# Patient Record
Sex: Male | Born: 1975 | Race: White | Hispanic: No | Marital: Married | Smoking: Former smoker
Health system: Southern US, Community
[De-identification: ages and names within clinical notes are randomized; demographics above are authoritative.]

## PROBLEM LIST (undated history)

## (undated) DIAGNOSIS — S92309A Fracture of unspecified metatarsal bone(s), unspecified foot, initial encounter for closed fracture: Secondary | ICD-10-CM

## (undated) DIAGNOSIS — R7303 Prediabetes: Secondary | ICD-10-CM

## (undated) DIAGNOSIS — R0683 Snoring: Secondary | ICD-10-CM

## (undated) HISTORY — PX: TONSILLECTOMY: SUR1361

---

## 2009-03-11 ENCOUNTER — Ambulatory Visit: Payer: Self-pay | Admitting: Diagnostic Radiology

## 2009-03-11 ENCOUNTER — Emergency Department (HOSPITAL_BASED_OUTPATIENT_CLINIC_OR_DEPARTMENT_OTHER): Admission: EM | Admit: 2009-03-11 | Discharge: 2009-03-11 | Payer: Self-pay | Admitting: Emergency Medicine

## 2011-02-17 ENCOUNTER — Encounter (INDEPENDENT_AMBULATORY_CARE_PROVIDER_SITE_OTHER): Payer: Self-pay | Admitting: General Surgery

## 2011-02-17 ENCOUNTER — Ambulatory Visit (INDEPENDENT_AMBULATORY_CARE_PROVIDER_SITE_OTHER): Payer: BC Managed Care – PPO | Admitting: General Surgery

## 2011-02-17 DIAGNOSIS — L0501 Pilonidal cyst with abscess: Secondary | ICD-10-CM

## 2011-02-17 DIAGNOSIS — L0591 Pilonidal cyst without abscess: Secondary | ICD-10-CM | POA: Insufficient documentation

## 2011-02-17 NOTE — Progress Notes (Signed)
Subjective:     Patient ID: Samuel Sellers, male   DOB: [birth year], 35 y.o.   MRN: [id number]    There were no vitals taken for this visit.    HPI  Is here for followup of his pilonidal cyst with abscess. He states the doxycycline helped that as well as his hidradenitis. Review of Systems     Objective:   Physical Exam Skin: His gluteal cleft area appears a healed wound. There still multiple small sinus tracts.    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 The hidradenitis has responded to the doxycycline. Plan:     Extensive pilonidal cystectomy. I have discussed the procedure and risks with him. Risks include but not limited to bleeding, wound problems, anesthesia, recurrence. Discussed the fact that he would have an open wound which may take months to heal. It were required daily dressing changes. He will have work restrictions. Seems to understand this and agrees with the plan.

## 2011-03-10 ENCOUNTER — Telehealth (INDEPENDENT_AMBULATORY_CARE_PROVIDER_SITE_OTHER): Payer: Self-pay

## 2011-03-10 NOTE — Telephone Encounter (Signed)
[facility]ellers.  He is out of town at [employer]. His surgery is Monday 7/23 - I okayed his CBC and BMET to be drawn before surgery

## 2011-03-15 DIAGNOSIS — L0591 Pilonidal cyst without abscess: Secondary | ICD-10-CM

## 2011-03-15 HISTORY — PX: PILONIDAL CYST EXCISION: SHX744

## 2011-03-17 ENCOUNTER — Telehealth (INDEPENDENT_AMBULATORY_CARE_PROVIDER_SITE_OTHER): Payer: Self-pay

## 2011-03-17 ENCOUNTER — Other Ambulatory Visit (INDEPENDENT_AMBULATORY_CARE_PROVIDER_SITE_OTHER): Payer: Self-pay

## 2011-03-17 ENCOUNTER — Ambulatory Visit (INDEPENDENT_AMBULATORY_CARE_PROVIDER_SITE_OTHER): Payer: BC Managed Care – PPO | Admitting: General Surgery

## 2011-03-17 DIAGNOSIS — L0591 Pilonidal cyst without abscess: Secondary | ICD-10-CM

## 2011-03-17 NOTE — Progress Notes (Signed)
Dressing changed by Milas Hock.  Wound looks good.  Will arrange for home health care to assist with wound care.

## 2011-03-18 ENCOUNTER — Encounter (INDEPENDENT_AMBULATORY_CARE_PROVIDER_SITE_OTHER): Payer: BC Managed Care – PPO

## 2011-03-22 ENCOUNTER — Other Ambulatory Visit (INDEPENDENT_AMBULATORY_CARE_PROVIDER_SITE_OTHER): Payer: Self-pay

## 2011-03-22 ENCOUNTER — Telehealth (INDEPENDENT_AMBULATORY_CARE_PROVIDER_SITE_OTHER): Payer: Self-pay | Admitting: General Surgery

## 2011-03-22 DIAGNOSIS — G8918 Other acute postprocedural pain: Secondary | ICD-10-CM

## 2011-03-22 MED ORDER — HYDROCODONE-ACETAMINOPHEN 5-325 MG PO TABS: 1.0000 | ORAL_TABLET | Freq: Four times a day (QID) | ORAL | Status: AC | PRN

## 2011-03-22 NOTE — Telephone Encounter (Signed)
Norco 5/325 #30 called to [facility] [address]  Pt aware.

## 2011-03-22 NOTE — Telephone Encounter (Signed)
Patient called back about his pain medicine. Patient is taking Oxycodone 5/325, has been taking two every four hours since Saturday with Ibuprofen between but still having a lot of sharp, stabbing pain and having trouble sleeping. Patient is not doing warm tub soaks, states he was told not to get this area wet. Please advise. [phone].

## 2011-03-22 NOTE — Telephone Encounter (Signed)
Spoke to Dr Abbey Chatters who advised pt to take Oxycodone and 3 ibuprofen 30 minutes prior to dressing changes and to make sure the gauze is soaked prior to removing. Patient states he will try this. He also states his real problem is sleeping. I advised he try the same method prior to going to bed. Also states he has 9 pills left and if he is taking 2 every 4 hours he needs a refill soon. Please advise.

## 2011-03-30 ENCOUNTER — Telehealth (INDEPENDENT_AMBULATORY_CARE_PROVIDER_SITE_OTHER): Payer: Self-pay

## 2011-03-30 ENCOUNTER — Encounter (INDEPENDENT_AMBULATORY_CARE_PROVIDER_SITE_OTHER): Payer: Self-pay

## 2011-03-30 ENCOUNTER — Other Ambulatory Visit (INDEPENDENT_AMBULATORY_CARE_PROVIDER_SITE_OTHER): Payer: Self-pay

## 2011-03-30 MED ORDER — OXYCODONE-ACETAMINOPHEN 5-325 MG PO TABS: 1.0000 | ORAL_TABLET | ORAL | Status: DC | PRN

## 2011-03-30 NOTE — Telephone Encounter (Signed)
Pt called for a refill on his Oxycodone.  The Vicodin is not helping his pain or helping him sleep.  Dr. Carolynne Edouard agreed to refill the Oxycodone 5/325, #40.  I called pt to make him aware.

## 2011-04-12 ENCOUNTER — Encounter (INDEPENDENT_AMBULATORY_CARE_PROVIDER_SITE_OTHER): Payer: Self-pay | Admitting: General Surgery

## 2011-04-14 ENCOUNTER — Encounter (INDEPENDENT_AMBULATORY_CARE_PROVIDER_SITE_OTHER): Payer: BC Managed Care – PPO | Admitting: General Surgery

## 2011-04-14 ENCOUNTER — Ambulatory Visit (INDEPENDENT_AMBULATORY_CARE_PROVIDER_SITE_OTHER): Payer: BC Managed Care – PPO | Admitting: General Surgery

## 2011-04-14 ENCOUNTER — Encounter (INDEPENDENT_AMBULATORY_CARE_PROVIDER_SITE_OTHER): Payer: Self-pay | Admitting: General Surgery

## 2011-04-14 DIAGNOSIS — L0591 Pilonidal cyst without abscess: Secondary | ICD-10-CM

## 2011-04-14 NOTE — Progress Notes (Signed)
Operation:  Pilonidal cystectomy  Date: March 15, 2011  Pathology: Consistent with diagnosis  HPI: He is here for a postoperative visit. No pain. No bleeding. There is a fair amount of drainage. No problems with dressing changes.   Physical Exam: Pilonidal wound is clean with granulation tissue forming. Some yellowish green drainage.   Assessment: Wound healing in well by secondary intention.  Plan: Continue dressing changes. Can return to work if he can keep the area adequately covered. Return visit one month.

## 2011-05-11 ENCOUNTER — Encounter (INDEPENDENT_AMBULATORY_CARE_PROVIDER_SITE_OTHER): Payer: Self-pay

## 2011-05-12 ENCOUNTER — Encounter (INDEPENDENT_AMBULATORY_CARE_PROVIDER_SITE_OTHER): Payer: Self-pay | Admitting: General Surgery

## 2011-05-12 ENCOUNTER — Encounter (INDEPENDENT_AMBULATORY_CARE_PROVIDER_SITE_OTHER): Payer: Self-pay

## 2011-05-12 ENCOUNTER — Ambulatory Visit (INDEPENDENT_AMBULATORY_CARE_PROVIDER_SITE_OTHER): Payer: BC Managed Care – PPO | Admitting: General Surgery

## 2011-05-12 DIAGNOSIS — L0591 Pilonidal cyst without abscess: Secondary | ICD-10-CM

## 2011-05-12 NOTE — Progress Notes (Signed)
Operation:  Pilonidal cystectomy  Date: March 15, 2011  Pathology: Consistent with diagnosis  HPI: He is here for another postoperative visit. No pain. No bleeding. There is much less drainage. No problems with dressing changes.   Physical Exam: Pilonidal wound is clean and is almost closed. Minimal drainage.   Assessment: Wound continues to heal  well by secondary intention.  Plan: Continue dressing changes. Can return to work next week.  Return visit one month.

## 2011-05-14 NOTE — Telephone Encounter (Signed)
Encounter resolved.

## 2011-06-09 ENCOUNTER — Encounter (INDEPENDENT_AMBULATORY_CARE_PROVIDER_SITE_OTHER): Payer: BC Managed Care – PPO | Admitting: General Surgery

## 2011-06-15 ENCOUNTER — Encounter (INDEPENDENT_AMBULATORY_CARE_PROVIDER_SITE_OTHER): Payer: Self-pay | Admitting: General Surgery

## 2011-09-23 ENCOUNTER — Emergency Department (HOSPITAL_BASED_OUTPATIENT_CLINIC_OR_DEPARTMENT_OTHER)
Admission: EM | Admit: 2011-09-23 | Discharge: 2011-09-23 | Disposition: A | Discharge status: Home / Self Care | Payer: Worker's Compensation | Attending: Emergency Medicine | Admitting: Emergency Medicine

## 2011-09-23 ENCOUNTER — Emergency Department (INDEPENDENT_AMBULATORY_CARE_PROVIDER_SITE_OTHER): Payer: Worker's Compensation

## 2011-09-23 ENCOUNTER — Encounter (HOSPITAL_BASED_OUTPATIENT_CLINIC_OR_DEPARTMENT_OTHER): Payer: Self-pay | Admitting: *Deleted

## 2011-09-23 DIAGNOSIS — M25569 Pain in unspecified knee: Secondary | ICD-10-CM

## 2011-09-23 MED ORDER — HYDROCODONE-ACETAMINOPHEN 5-500 MG PO TABS: 1.0000 | ORAL_TABLET | Freq: Four times a day (QID) | ORAL | Status: AC | PRN

## 2011-09-23 NOTE — ED Notes (Signed)
Right knee injury while playing basketball. He fell onto his knee. Drove himself here and is ambulatory. States no pain with standing only painful when he bends.

## 2011-09-23 NOTE — ED Provider Notes (Signed)
History     CSN: [id number]  Arrival date & time 09/23/11  1137   First MD Initiated Contact with Patient 09/23/11 1205      Chief Complaint  Patient presents with  . Knee Pain    (Consider location/radiation/quality/duration/timing/severity/associated sxs/prior treatment) HPI Comments: Pt states that he was playing basketball and he fell on his knee:pt states that he is having patellar pain with pain  Patient is a 36 y.o. male presenting with knee pain. The history is provided by the patient.  Knee Pain This is a new problem. The current episode started today. The problem occurs constantly. The problem has been unchanged. The symptoms are aggravated by bending. He has tried acetaminophen for the symptoms.  Knee Pain This is a new problem. The current episode started today. The problem occurs constantly. The problem has been unchanged. The symptoms are aggravated by bending. He has tried acetaminophen for the symptoms.    Past Medical History  Diagnosis Date  . Pilonidal cyst     Past Surgical History  Procedure Date  . Tonsillectomy   . Pilonidal cyst excision 03/15/11    Family History  Problem Relation Age of Onset  . Cancer Mother     breast  . Diabetes Mother   . Cancer Father     skin    History  Substance Use Topics  . Smoking status: Former Games developer  . Smokeless tobacco: Not on file  . Alcohol Use: 0.0 oz/week    6-12 Cans of beer per week      Review of Systems  All other systems reviewed and are negative.    Allergies  Review of patient's allergies indicates no known allergies.  Home Medications   Current Outpatient Rx  Name Route Sig Dispense Refill  . B COMPLEX PO TABS Oral Take 1 tablet by mouth daily.        BP 123/89  Pulse 104  Temp(Src) 98 F (36.7 C) (Oral)  Resp 20  SpO2 99%  Physical Exam  Nursing note and vitals reviewed. Constitutional: He is oriented to person, place, and time. He appears well-developed and  well-nourished.  HENT:  Head: Normocephalic and atraumatic.  Cardiovascular: Normal rate and regular rhythm.   Pulmonary/Chest: Effort normal and breath sounds normal.  Musculoskeletal: Normal range of motion.       No obvious swelling or deformity noted to the right knee  Neurological: He is alert and oriented to person, place, and time.  Skin: Skin is warm and dry.    ED Course  Procedures (including critical care time)  Labs Reviewed - No data to display Dg Knee Complete 4 Views Right  09/23/2011  *RADIOLOGY REPORT*  Clinical Data: Knee pain  RIGHT KNEE - COMPLETE 4+ VIEW  Comparison: None  Findings: There is no evidence of fracture or dislocation.  There is no evidence of arthropathy or other focal bone abnormality. Soft tissues are unremarkable.  IMPRESSION: Negative exam.  Original Report Authenticated By: Rosealee Albee, M.D.     1. Knee pain       MDM  No acute bony of soft tissue abnormality noted:pt to follow up with [facility] for continued symptoms    Medical screening examination/treatment/procedure(s) were performed by non-physician practitioner and as supervising physician I was immediately available for consultation/collaboration. Osvaldo Human, M.D.    Teressa Lower, NP 09/23/11 1310  Carleene Cooper III, MD 09/23/11 2012

## 2014-12-16 ENCOUNTER — Encounter: Payer: Self-pay | Admitting: Family Medicine

## 2014-12-16 ENCOUNTER — Encounter: Payer: Self-pay | Admitting: Physician Assistant

## 2014-12-16 ENCOUNTER — Ambulatory Visit (INDEPENDENT_AMBULATORY_CARE_PROVIDER_SITE_OTHER): Payer: BLUE CROSS/BLUE SHIELD | Admitting: Physician Assistant

## 2014-12-16 VITALS — BP 126/96 | HR 96 | Temp 98.9°F | Resp 20 | Wt 327.0 lb

## 2014-12-16 DIAGNOSIS — J029 Acute pharyngitis, unspecified: Secondary | ICD-10-CM | POA: Diagnosis not present

## 2014-12-16 DIAGNOSIS — R509 Fever, unspecified: Secondary | ICD-10-CM | POA: Diagnosis not present

## 2014-12-16 LAB — INFLUENZA A AND B
INFLUENZA B AG: NEGATIVE
Inflenza A Ag: NEGATIVE

## 2014-12-16 LAB — RAPID STREP SCREEN (MED CTR MEBANE ONLY): Streptococcus, Group A Screen (Direct): NEGATIVE

## 2014-12-16 NOTE — Progress Notes (Signed)
    Patient ID: Melburn PopperRichard C Hemm MRN: [id number], DOB: 1976/07/06, 39 y.o. Date of Encounter: 12/16/2014, 10:33 AM    Chief Complaint:  Chief Complaint  Patient presents with  . sick x 1 day    fever,chills, sore throat, congestion     HPI: 39 y.o. year old white male says he just started getting sick yesterday.   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  Works 24 hour shifts.  Says got off work yesterday morning. Then yesterday afternoon "hit him like a ton of bricks"--weak, tired, chills. Fever last night. Mild sore throat and cough.  Last dose Ibuprofen 4 a.m.  Is off work today. Scheduled to work Tuesday and Thursday.  Is signed up for a class this weekend--says it will be labor intensive, but he is really looking forward to it. Says he signed up for it a year ago. Says his info said to let them know if not coming b/c >60 people on waiting list.      Home Meds:   Outpatient Prescriptions Prior to Visit  Medication Sig Dispense Refill  . b complex vitamins tablet Take 1 tablet by mouth daily.       No facility-administered medications prior to visit.    Allergies: No Known Allergies    Review of Systems: See HPI for pertinent ROS. All other ROS negative.    Physical Exam: Blood pressure 126/96, pulse 96, temperature 98.9 F (37.2 C), temperature source Oral, resp. rate 20, weight 327 lb (148.326 kg)., Body mass index is 39.82 kg/(m^2). General:  Obese WM. Appears in no acute distress. HEENT: Normocephalic, atraumatic, eyes without discharge, sclera non-icteric, nares are without discharge. Bilateral auditory canals clear, TM's are without perforation, pearly grey and translucent with reflective cone of light bilaterally. Oral cavity moist, posterior pharynx with moderate erythema across upper posterior pharynx. No exudate,  peritonsillar abscess. Neck: Supple. No thyromegaly. Positive tenderness with palpation of anterior cervical nodes but unable to palpate any enlarged nodes.  Lungs:  Clear bilaterally to auscultation without wheezes, rales, or rhonchi. Breathing is unlabored. Heart: Regular rhythm. No murmurs, rubs, or gallops. Msk:  Strength and tone normal for age. Extremities/Skin: Warm and dry.  No rashes. Neuro: Alert and oriented X 3. Moves all extremities spontaneously. Gait is normal. CNII-XII grossly in tact. Psych:  Responds to questions appropriately with a normal affect.      ASSESSMENT AND PLAN:  39 y.o. year old male with  1. Viral pharyngitis RST and Flu tests negative.  Treat symptomatically. Note for oow tomorrow.  Call me if fever / symptoms worsen.  Call me if fever not decreasing/resolving over next 48 hours or if symptoms not improving over next 48 hours. (See HPI)  2. Fever and chills - Rapid strep screen - Influenza a and b  3. Sorethroat - Rapid strep screen - Influenza a and b   Signed, [address] Buck[address] LaneMary Beth PearlDixon, [place], [facility] 12/16/2014 10:33 AM

## 2015-04-23 ENCOUNTER — Encounter: Payer: Self-pay | Admitting: Family Medicine

## 2015-04-23 ENCOUNTER — Encounter: Payer: Self-pay | Admitting: Physician Assistant

## 2015-04-23 ENCOUNTER — Ambulatory Visit (INDEPENDENT_AMBULATORY_CARE_PROVIDER_SITE_OTHER): Payer: BLUE CROSS/BLUE SHIELD | Admitting: Physician Assistant

## 2015-04-23 VITALS — BP 96/70 | HR 108 | Temp 103.1°F | Resp 24 | Ht 76.0 in | Wt 317.0 lb

## 2015-04-23 DIAGNOSIS — M791 Myalgia, unspecified site: Secondary | ICD-10-CM

## 2015-04-23 DIAGNOSIS — R6883 Chills (without fever): Secondary | ICD-10-CM

## 2015-04-23 DIAGNOSIS — B349 Viral infection, unspecified: Secondary | ICD-10-CM | POA: Diagnosis not present

## 2015-04-23 DIAGNOSIS — R5081 Fever presenting with conditions classified elsewhere: Secondary | ICD-10-CM | POA: Diagnosis not present

## 2015-04-23 LAB — CBC W/MCH & 3 PART DIFF
HCT: 48 % (ref 39.0–52.0)
HEMOGLOBIN: 16.4 g/dL (ref 13.0–17.0)
LYMPHS ABS: 0.6 10*3/uL — AB (ref 0.7–4.0)
LYMPHS PCT: 20 % (ref 12–46)
MCH: 31.8 pg (ref 26.0–34.0)
MCHC: 34.2 g/dL (ref 30.0–36.0)
MCV: 93 fL (ref 78.0–100.0)
NEUTROS PCT: 68 % (ref 43–77)
Neutro Abs: 2.1 10*3/uL (ref 1.7–7.7)
PLATELETS: 129 10*3/uL — AB (ref 150–400)
RBC: 5.16 MIL/uL (ref 4.22–5.81)
RDW: 13.9 % (ref 11.5–15.5)
WBC mixed population %: 12 % (ref 3–18)
WBC mixed population: 0.4 10*3/uL (ref 0.1–1.8)
WBC: 3.1 10*3/uL — AB (ref 4.0–10.5)

## 2015-04-23 LAB — URINALYSIS, MICROSCOPIC ONLY
CRYSTALS: NONE SEEN [HPF]
Casts: NONE SEEN [LPF]
WBC UA: NONE SEEN WBC/HPF (ref <=5)
YEAST: NONE SEEN [HPF]

## 2015-04-23 LAB — URINALYSIS, ROUTINE W REFLEX MICROSCOPIC
BILIRUBIN URINE: NEGATIVE
GLUCOSE, UA: NEGATIVE
Hgb urine dipstick: NEGATIVE
KETONES UR: NEGATIVE
Leukocytes, UA: NEGATIVE
Nitrite: NEGATIVE
pH: 5.5 (ref 5.0–8.0)

## 2015-04-23 NOTE — Progress Notes (Signed)
Patient ID: Samuel Sellers MRN: [id number], DOB: 11-28-1975, 39 y.o. Date of Encounter: 04/23/2015, 3:57 PM    Chief Complaint:  Chief Complaint  Patient presents with  . Cold chills, achey all over, side pain, fatigue, cough     HPI: 39 y.o. year old white male presents with above symptoms.  Says that symptoms started Monday night 04/21/15. Says that at that time he developed chills. Also felt achy all over. He has not checked his temperature at home any to know what it has been running. Says he's had chills to the point that he has been having to stay under blankets or go outside where it is close to 100 just to warm up. Says he has had minimal cough. No sore throat. No nasal congestion or mucus from the nose. No abdominal pain no nausea vomiting or diarrhea. No dysuria. Has found no ticks on him. Has seen no rash. Has had no headache. No known sick contacts. Works for [employer]. Missed some work yesterday (works 24 hour shift). Supposed to work tomorrow and Friday then off Saturday Sunday and supposed to return Monday.     Home Meds:   Outpatient Prescriptions Prior to Visit  Medication Sig Dispense Refill  . b complex vitamins tablet Take 1 tablet by mouth daily.       No facility-administered medications prior to visit.    Allergies: No Known Allergies    Review of Systems: See HPI for pertinent ROS. All other ROS negative.    Physical Exam: Blood pressure 96/70, pulse 108, temperature 103.1 F (39.5 C), temperature source Oral, resp. rate 24, height 6\' 4"  (1.93 m), weight 317 lb (143.79 kg)., Body mass index is 38.6 kg/(m^2). General:  Tall, Obese WM. Appears in no acute distress. HEENT: Normocephalic, atraumatic, eyes without discharge, sclera non-icteric, nares are without discharge. Bilateral auditory canals clear, TM's are without perforation, pearly grey and translucent with reflective cone of light bilaterally. Oral cavity moist,  posterior pharynx without exudate, erythema, peritonsillar abscess.  Neck: Supple. No thyromegaly. No lymphadenopathy. Lungs: Clear bilaterally to auscultation without wheezes, rales, or rhonchi. Breathing is unlabored. Heart: Regular rhythm. No murmurs, rubs, or gallops. Abdomen: Soft, non-tender, non-distended with normoactive bowel sounds. No hepatomegaly. No rebound/guarding. No obvious abdominal masses. Msk:  Strength and tone normal for age. Extremities/Skin: Warm and dry. No rashes. Neuro: Alert and oriented X 3. Moves all extremities spontaneously. Gait is normal. CNII-XII grossly in tact. Psych:  Responds to questions appropriately with a normal affect.   Results for orders placed or performed in visit on 04/23/15  CBC w/MCH & 3 Part Diff  Result Value Ref Range   WBC 3.1 (L) 4.0 - 10.5 K/uL   RBC 5.16 4.22 - 5.81 MIL/uL   Hemoglobin 16.4 13.0 - 17.0 g/dL   HCT 40.9 81.1 - 91.4 %   MCV 93.0 78.0 - 100.0 fL   MCH 31.8 26.0 - 34.0 pg   MCHC 34.2 30.0 - 36.0 g/dL   RDW 78.2 95.6 - 21.3 %   Platelets 129 (L) 150 - 400 K/uL   Neutrophils Relative % 68 43 - 77 %   Neutro Abs 2.1 1.7 - 7.7 K/uL   Lymphocytes Relative 20 12 - 46 %   Lymphs Abs 0.6 (L) 0.7 - 4.0 K/uL   WBC mixed population % 12 3 - 18 %   WBC mixed population 0.4 0.1 - 1.8 K/uL  Urinalysis, Routine w reflex microscopic (not at [facility])  Result Value Ref Range   Color, Urine AMBER (A) YELLOW   APPearance CLEAR CLEAR   Specific Gravity, Urine >1.030 1.001 - 1.035   pH 5.5 5.0 - 8.0   Glucose, UA NEGATIVE NEGATIVE   Bilirubin Urine NEGATIVE NEGATIVE   Ketones, ur NEGATIVE NEGATIVE   Hgb urine dipstick NEGATIVE NEGATIVE   Protein, ur 1+ (A) NEGATIVE   Nitrite NEGATIVE NEGATIVE   Leukocytes, UA NEGATIVE NEGATIVE  Urine Microscopic  Result Value Ref Range   WBC, UA NONE SEEN <=5 WBC/HPF   RBC / HPF 0-2 <=2 RBC/HPF   Squamous Epithelial / LPF 0-5 <=5 HPF   Bacteria, UA FEW (A) NONE SEEN HPF   Crystals NONE SEEN  NONE SEEN HPF   Casts NONE SEEN NONE SEEN LPF   Yeast NONE SEEN NONE SEEN HPF   Urine-Other MUCUS STRANDS      ASSESSMENT AND PLAN:  39 y.o. year old male with     1.Viral illness Reviewed CBC while pt here in office. WBCs and neutrophils normal. Consistent with this being viral illness versus bacterial. Urinalysis normal. Will follow-up with remaining lab results. As well, if patient starts to develop any new additional symptoms especially rash or headache, etc--then he is to follow-up immediately. He is to take Tylenol and Motrin regularly to keep fever controlled and this also should help relieve symptoms of achiness and chills. He says that he does have a thermometer at home. I told him to let the medications wear off and check temperature tomorrow afternoon and if it is any higher, then follow-up immediately. If fever is not decreasing over the next 24-48 hours then follow-up as well.    Note given for out of work yesterday, out of work tomorrow which is September 1 and the next day which is September 2. He is then already scheduled to be out of work September 3 and 4th and can return to work Monday, September 5.  2. Fever presenting with conditions classified elsewhere - CBC w/MCH & 3 Part Diff - B. burgdorfi antibodies by WB - Rocky mtn spotted fvr abs pnl(IgG+IgM) - Urinalysis, Routine w reflex microscopic (not at [facility])  3. Chills - CBC w/MCH & 3 Part Diff - B. burgdorfi antibodies by WB - Rocky mtn spotted fvr abs pnl(IgG+IgM) - Urinalysis, Routine w reflex microscopic (not at [facility])  4. Generalized muscle ache - CBC w/MCH & 3 Part Diff - B. burgdorfi antibodies by WB - Rocky mtn spotted fvr abs pnl(IgG+IgM) - Urinalysis, Routine w reflex microscopic (not at [facility])   Signed, [facility] [place], [place], [facility] 04/23/2015 3:57 PM

## 2015-04-24 LAB — ROCKY MTN SPOTTED FVR ABS PNL(IGG+IGM)
RMSF IGM: 0.55 IV
RMSF IgG: 0.88 IV — ABNORMAL HIGH

## 2015-04-24 LAB — B. BURGDORFI ANTIBODIES BY WB
B BURGDORFERI IGG ABS (IB): NEGATIVE
B burgdorferi IgM Abs (IB): NEGATIVE

## 2015-06-18 ENCOUNTER — Emergency Department (HOSPITAL_BASED_OUTPATIENT_CLINIC_OR_DEPARTMENT_OTHER): Payer: Worker's Compensation

## 2015-06-18 ENCOUNTER — Encounter (HOSPITAL_BASED_OUTPATIENT_CLINIC_OR_DEPARTMENT_OTHER): Payer: Self-pay

## 2015-06-18 ENCOUNTER — Emergency Department (HOSPITAL_BASED_OUTPATIENT_CLINIC_OR_DEPARTMENT_OTHER)
Admission: EM | Admit: 2015-06-18 | Discharge: 2015-06-18 | Disposition: A | Discharge status: Home / Self Care | Payer: Worker's Compensation | Attending: Emergency Medicine | Admitting: Emergency Medicine

## 2015-06-18 DIAGNOSIS — Y9389 Activity, other specified: Secondary | ICD-10-CM | POA: Insufficient documentation

## 2015-06-18 DIAGNOSIS — Z87891 Personal history of nicotine dependence: Secondary | ICD-10-CM | POA: Insufficient documentation

## 2015-06-18 DIAGNOSIS — Y9289 Other specified places as the place of occurrence of the external cause: Secondary | ICD-10-CM | POA: Diagnosis not present

## 2015-06-18 DIAGNOSIS — Z872 Personal history of diseases of the skin and subcutaneous tissue: Secondary | ICD-10-CM | POA: Insufficient documentation

## 2015-06-18 DIAGNOSIS — S96812A Strain of other specified muscles and tendons at ankle and foot level, left foot, initial encounter: Secondary | ICD-10-CM | POA: Diagnosis not present

## 2015-06-18 DIAGNOSIS — X58XXXA Exposure to other specified factors, initial encounter: Secondary | ICD-10-CM | POA: Diagnosis not present

## 2015-06-18 DIAGNOSIS — S93699A Other sprain of unspecified foot, initial encounter: Secondary | ICD-10-CM

## 2015-06-18 DIAGNOSIS — Y998 Other external cause status: Secondary | ICD-10-CM | POA: Diagnosis not present

## 2015-06-18 DIAGNOSIS — S99922A Unspecified injury of left foot, initial encounter: Secondary | ICD-10-CM | POA: Diagnosis present

## 2015-06-18 MED ORDER — NAPROXEN 500 MG PO TABS: 500.0000 mg | ORAL_TABLET | Freq: Two times a day (BID) | ORAL | Status: DC

## 2015-06-18 NOTE — Discharge Instructions (Signed)
Please read and follow all provided instructions.  Your diagnoses today include:  1. Plantar fascia rupture     Tests performed today include:  An x-ray of the affected area - does NOT show any broken bones  Vital signs. See below for your results today.   Medications prescribed:   Naproxen - anti-inflammatory pain medication  Do not exceed 500mg  naproxen every 12 hours, take with food  You have been prescribed an anti-inflammatory medication or NSAID. Take with food. Take smallest effective dose for the shortest duration needed for your pain. Stop taking if you experience stomach pain or vomiting.   Take any prescribed medications only as directed.  Home care instructions:   Follow any educational materials contained in this packet  Follow R.I.C.E. Protocol:  R - rest your injury   I  - use ice on injury without applying directly to skin  C - compress injury with bandage or splint  E - elevate the injury as much as possible  Follow-up instructions: Please follow-up with your primary care provider or the provided orthopedic physician (bone specialist) if you continue to have significant pain in 1 week. In this case you may have a more severe injury that requires further care.   Return instructions:   Please return if your toes or feet are numb or tingling, appear gray or blue, or you have severe pain (also elevate the leg and loosen splint or wrap if you were given one)  Please return to the Emergency Department if you experience worsening symptoms.   Please return if you have any other emergent concerns.  Additional Information:  Your vital signs today were: BP 103/73 mmHg   Pulse 120   Temp(Src) 98.4 F (36.9 C) (Oral)   Resp 18   Ht 6' (1.829 m)   Wt 320 lb (145.151 kg)   BMI 43.39 kg/m2   SpO2 97% If your blood pressure (BP) was elevated above 135/85 this visit, please have this repeated by your doctor within one month. -------------- If prescribed crutches  for your injury: use crutches with non-weight bearing for the first few days. Then, you may walk as the pain allows, or as instructed. Start gradually with weight bearing on the affected side. Once you can walk pain free, then try jogging. When you can run forwards, then you can try moving side-to-side. If you cannot walk without crutches in one week, you need a re-check. --------------

## 2015-06-18 NOTE — ED Provider Notes (Signed)
CSN: [id number]     Arrival date & time 06/18/15  1608 History   First MD Initiated Contact with Patient 06/18/15 1613     Chief Complaint  Patient presents with  . Foot Injury     (Consider location/radiation/quality/duration/timing/severity/associated sxs/prior Treatment) HPI Comments: Patient presents with complaint of acute onset left foot pain which started during an agility drill. Patient is a IT sales professional. He was stepping down on his left foot when he felt a pop and immediate pain to the plantar aspect of the foot. He is unable to bear weight due to pain. No other injuries. No treatments prior to arrival. Patient denies numbness or tingling. No knee or hip pains. Course is constant. Pain is worse with ambulation and bearing weight.  The history is provided by the patient.    Past Medical History  Diagnosis Date  . Pilonidal cyst    Past Surgical History  Procedure Laterality Date  . Tonsillectomy    . Pilonidal cyst excision  03/15/11   Family History  Problem Relation Age of Onset  . Cancer Mother     breast  . Diabetes Mother   . Cancer Father     skin   Social History  Substance Use Topics  . Smoking status: Former Games developer  . Smokeless tobacco: None  . Alcohol Use: 0.0 oz/week    6-12 Cans of beer per week    Review of Systems  Constitutional: Negative for activity change.  Musculoskeletal: Positive for myalgias and gait problem. Negative for back pain, joint swelling, arthralgias and neck pain.  Skin: Negative for wound.  Neurological: Negative for weakness and numbness.      Allergies  Review of patient's allergies indicates no known allergies.  Home Medications   Prior to Admission medications   Not on File   BP 103/73 mmHg  Pulse 120  Temp(Src) 98.4 F (36.9 C) (Oral)  Resp 18  Ht 6' (1.829 m)  Wt 320 lb (145.151 kg)  BMI 43.39 kg/m2  SpO2 97% Physical Exam  Constitutional: He appears well-developed and well-nourished.  HENT:  Head:  Normocephalic and atraumatic.  Eyes: Conjunctivae are normal.  Neck: Normal range of motion. Neck supple.  Cardiovascular: Normal pulses.   Pulses:      Dorsalis pedis pulses are 2+ on the right side, and 2+ on the left side.  Musculoskeletal: He exhibits tenderness. He exhibits no edema.       Left hip: Normal.       Left knee: Normal.       Left ankle: Normal. He exhibits normal range of motion. No tenderness. Achilles tendon exhibits normal Thompson's test results.       Left upper leg: Normal.       Left lower leg: Normal.       Left foot: There is tenderness. There is normal range of motion, no bony tenderness and no swelling.  Neurological: He is alert. No sensory deficit.  Motor, sensation, and vascular distal to the injury is fully intact.   Skin: Skin is warm and dry.  Psychiatric: He has a normal mood and affect.  Nursing note and vitals reviewed.   ED Course  Procedures (including critical care time) Labs Review Labs Reviewed - No data to display  Imaging Review Dg Foot Complete Left  06/18/2015  CLINICAL DATA:  Pain after stepping from ladder EXAM: LEFT FOOT - COMPLETE 3+ VIEW COMPARISON:  None. FINDINGS: Frontal, oblique, and lateral views were obtained. There is no demonstrable fracture  or dislocation. Joint spaces appear intact. No erosive change. There is a small posterior calcaneal spur. IMPRESSION: Small posterior calcaneal spur. No demonstrable fracture or dislocation. No appreciable joint space narrowing. Electronically Signed   By: Bretta BangWilliam  Woodruff III M.D.   On: 06/18/2015 16:48   I have personally reviewed and evaluated these images and lab results as part of my medical decision-making.   EKG Interpretation None       4:55 PM Patient seen and examined. Work-up initiated.   Vital signs reviewed and are as follows: BP 103/73 mmHg  Pulse 120  Temp(Src) 98.4 F (36.9 C) (Oral)  Resp 18  Ht 6' (1.829 m)  Wt 320 lb (145.151 kg)  BMI 43.39 kg/m2  SpO2  97%  Patient updated on x-ray results. Provided with Ace wrap and crutches. Referral to Dr. Pearletha ForgeHudnall for further evaluation and treatment. Patient counseled on rice protocol and use of NSAIDs for pain.  MDM   Final diagnoses:  Plantar fascia rupture   Patient with injury mechanism and exam concerning for rupture of the plantar fascia left foot. X-rays negative. Treatment and follow-up as above. Left lower extremity is neurovascularly intact.    Renne CriglerJoshua Keegan Bensch, PA-C 06/18/15 1734  Arby BarretteMarcy Pfeiffer, MD 06/23/15 [phone]

## 2015-06-18 NOTE — ED Notes (Signed)
Patient to treatment room via wheelchair. 

## 2015-06-18 NOTE — ED Notes (Signed)
Foot injury going down steps during agility test at work-heard a pop to left foot

## 2015-06-18 NOTE — ED Notes (Signed)
Patient transported to X-ray 

## 2015-06-20 ENCOUNTER — Ambulatory Visit (INDEPENDENT_AMBULATORY_CARE_PROVIDER_SITE_OTHER): Payer: Worker's Compensation | Admitting: Family Medicine

## 2015-06-20 ENCOUNTER — Encounter: Payer: Self-pay | Admitting: Family Medicine

## 2015-06-20 VITALS — BP 117/79 | HR 80 | Ht 77.0 in | Wt 320.0 lb

## 2015-06-20 DIAGNOSIS — S99922A Unspecified injury of left foot, initial encounter: Secondary | ICD-10-CM

## 2015-06-20 NOTE — Patient Instructions (Signed)
You partially tore an intrinsic muscle of your foot. This is treated similarly to plantar fascia strain/tear though. I would expect you to return to normal activities somewhere in the 2-6 week range. Take tylenol or aleve as needed for pain  Do simple motion exercises of your ankle (up/downs, pretending to draw the alphabet with your ankle). Ice heel for 15 minutes as needed. Elevate above your heart as needed if swollen, bruised. Avoid flat shoes/barefoot walking as much as possible. Arch straps have been shown to help with pain - wear these when up and walking around. Wear shoes with good arch support - you can try something like dr. Jari Sportsmanscholls active series if your shoes don't feel supportive enough. Follow up with me in 2 weeks.

## 2015-06-24 DIAGNOSIS — S99922A Unspecified injury of left foot, initial encounter: Secondary | ICD-10-CM | POA: Insufficient documentation

## 2015-06-24 NOTE — Assessment & Plan Note (Signed)
2/2 tear of deep intrinsic flexor of foot.  Arch binders, start ROM exercises.  Icing, tylenol/nsaids if needed.  Avoid flat shoes, barefoot walking - encouraged good arch supoprts as well.  F/u in 2 weeks.  Out of work in meantime.

## 2015-06-24 NOTE — Progress Notes (Signed)
PCP: Pcp Not In System  Subjective:   HPI: Patient is a 39 y.o. male here for left foot injury.  Patient reports on 10/26 he was doing physical agility test, coming down steps for work and felt a pop plantar left foot. + bruising and swelling. Difficulty bearing weight initially. Pain is sharp, 2/10 at rest, can get up to 10/10 with walking. No prior issues. No skin changes, fever, other complaints.  Past Medical History  Diagnosis Date  . Pilonidal cyst     Current Outpatient Prescriptions on File Prior to Visit  Medication Sig Dispense Refill  . naproxen (NAPROSYN) 500 MG tablet Take 1 tablet (500 mg total) by mouth 2 (two) times daily. 20 tablet 0   No current facility-administered medications on file prior to visit.    Past Surgical History  Procedure Laterality Date  . Tonsillectomy    . Pilonidal cyst excision  03/15/11    Allergies  Allergen Reactions  . Erythromycin     Social History   Social History  . Marital Status: Divorced    Spouse Name: N/A  . Number of Children: N/A  . Years of Education: N/A   Occupational History  . Not on file.   Social History Main Topics  . Smoking status: Former Games developermoker  . Smokeless tobacco: Current User  . Alcohol Use: 0.0 oz/week    6-12 Cans of beer per week  . Drug Use: No  . Sexual Activity: Not on file   Other Topics Concern  . Not on file   Social History Narrative    Family History  Problem Relation Age of Onset  . Cancer Mother     breast  . Diabetes Mother   . Cancer Father     skin    BP 117/79 mmHg  Pulse 80  Ht 6\' 5"  (1.956 m)  Wt 320 lb (145.151 kg)  BMI 37.94 kg/m2  Review of Systems: See HPI above.    Objective:  Physical Exam:  Gen: NAD, comfortable in room.  Left foot/ankle: No gross deformity, swelling, ecchymoses FROM ankle without pain.  On passive full dorsiflexion has plantar foot pain. TTP midportion of plantar foot.  No other tenderness. Negative ant drawer and talar  tilt.   Negative syndesmotic compression. Negative metatarsal squeeze. Thompsons test negative. NV intact distally.  Right foot/ankle: FROM without pain.  MSK u/s:  Metatarsals normal of left foot.  Plantar fascia without evidence of tear or thickening.  Deep musculature of foot however shows one of intrinsic muscles with a small hypoechoic area consistent with partial tear.    Assessment & Plan:  1. Left foot injury - 2/2 tear of deep intrinsic flexor of foot.  Arch binders, start ROM exercises.  Icing, tylenol/nsaids if needed.  Avoid flat shoes, barefoot walking - encouraged good arch supoprts as well.  F/u in 2 weeks.  Out of work in meantime.

## 2015-07-04 ENCOUNTER — Encounter (INDEPENDENT_AMBULATORY_CARE_PROVIDER_SITE_OTHER): Payer: Self-pay

## 2015-07-04 ENCOUNTER — Encounter: Payer: Self-pay | Admitting: Family Medicine

## 2015-07-04 ENCOUNTER — Ambulatory Visit (INDEPENDENT_AMBULATORY_CARE_PROVIDER_SITE_OTHER): Payer: Worker's Compensation | Admitting: Family Medicine

## 2015-07-04 VITALS — BP 136/91 | HR 80 | Ht 77.0 in | Wt 320.0 lb

## 2015-07-04 DIAGNOSIS — S99922D Unspecified injury of left foot, subsequent encounter: Secondary | ICD-10-CM | POA: Diagnosis not present

## 2015-07-04 NOTE — Patient Instructions (Signed)
Wear the sports insoles when up and walking around. Continue home exercises. Follow up with me in 4 weeks. We will likely make you custom orthotics at that appointment.

## 2015-07-07 NOTE — Progress Notes (Signed)
PCP: Pcp Not In System  Subjective:   HPI: Patient is a 39 y.o. male here for left foot injury.  10/28: Patient reports on 10/26 he was doing physical agility test, coming down steps for work and felt a pop plantar left foot. + bruising and swelling. Difficulty bearing weight initially. Pain is sharp, 2/10 at rest, can get up to 10/10 with walking. No prior issues. No skin changes, fever, other complaints.  11/11: Patient reports he feels 85% improved from last visit. Doing well with arch binders, home exercises, arch supports. Some soreness with prolonged standing. Pain currently 0/10. No skin changes, fever.  Past Medical History  Diagnosis Date  . Pilonidal cyst     Current Outpatient Prescriptions on File Prior to Visit  Medication Sig Dispense Refill  . naproxen (NAPROSYN) 500 MG tablet Take 1 tablet (500 mg total) by mouth 2 (two) times daily. 20 tablet 0   No current facility-administered medications on file prior to visit.    Past Surgical History  Procedure Laterality Date  . Tonsillectomy    . Pilonidal cyst excision  03/15/11    Allergies  Allergen Reactions  . Erythromycin     Social History   Social History  . Marital Status: Divorced    Spouse Name: N/A  . Number of Children: N/A  . Years of Education: N/A   Occupational History  . Not on file.   Social History Main Topics  . Smoking status: Former Games developermoker  . Smokeless tobacco: Current User  . Alcohol Use: 0.0 oz/week    6-12 Cans of beer per week  . Drug Use: No  . Sexual Activity: Not on file   Other Topics Concern  . Not on file   Social History Narrative    Family History  Problem Relation Age of Onset  . Cancer Mother     breast  . Diabetes Mother   . Cancer Father     skin    BP 136/91 mmHg  Pulse 80  Ht 6\' 5"  (1.956 m)  Wt 320 lb (145.151 kg)  BMI 37.94 kg/m2  Review of Systems: See HPI above.    Objective:  Physical Exam:  Gen: NAD, comfortable in  room.  Left foot/ankle: No gross deformity, swelling, ecchymoses FROM ankle without pain. No TTP midportion of plantar foot.  No other tenderness. Negative ant drawer and talar tilt.   Negative syndesmotic compression. Negative metatarsal squeeze. Thompsons test negative. NV intact distally.  Right foot/ankle: FROM without pain.    Assessment & Plan:  1. Left foot injury - 2/2 tear of deep intrinsic flexor of foot.  Improving with arch supports, arch binders, home exercises.  Icing, tylenol/nsaids if needed.  F/u in 4 weeks.  Will likely make custom orthotics at that appointment.  Return to full duty.

## 2015-07-07 NOTE — Assessment & Plan Note (Signed)
2/2 tear of deep intrinsic flexor of foot.  Improving with arch supports, arch binders, home exercises.  Icing, tylenol/nsaids if needed.  F/u in 4 weeks.  Will likely make custom orthotics at that appointment.  Return to full duty.

## 2015-07-31 ENCOUNTER — Encounter: Payer: Self-pay | Admitting: Family Medicine

## 2016-01-05 ENCOUNTER — Telehealth: Payer: Self-pay | Admitting: Physician Assistant

## 2016-01-05 ENCOUNTER — Ambulatory Visit: Payer: BLUE CROSS/BLUE SHIELD | Admitting: Family Medicine

## 2016-01-05 ENCOUNTER — Encounter: Payer: Self-pay | Admitting: Physician Assistant

## 2016-01-05 ENCOUNTER — Ambulatory Visit (INDEPENDENT_AMBULATORY_CARE_PROVIDER_SITE_OTHER): Payer: 59 | Admitting: Family Medicine

## 2016-01-05 ENCOUNTER — Encounter: Payer: Self-pay | Admitting: Family Medicine

## 2016-01-05 VITALS — BP 126/96 | HR 102 | Temp 98.6°F | Resp 20 | Ht 76.5 in | Wt 320.0 lb

## 2016-01-05 DIAGNOSIS — E669 Obesity, unspecified: Secondary | ICD-10-CM | POA: Diagnosis not present

## 2016-01-05 DIAGNOSIS — R739 Hyperglycemia, unspecified: Secondary | ICD-10-CM

## 2016-01-05 DIAGNOSIS — R000 Tachycardia, unspecified: Secondary | ICD-10-CM | POA: Diagnosis not present

## 2016-01-05 LAB — CBC WITH DIFFERENTIAL/PLATELET
BASOS ABS: 85 {cells}/uL (ref 0–200)
BASOS PCT: 1 %
EOS ABS: 85 {cells}/uL (ref 15–500)
Eosinophils Relative: 1 %
HEMATOCRIT: 49.4 % (ref 38.5–50.0)
HEMOGLOBIN: 16.6 g/dL (ref 13.0–17.0)
Lymphocytes Relative: 28 %
Lymphs Abs: 2380 cells/uL (ref 850–3900)
MCH: 30.7 pg (ref 27.0–33.0)
MCHC: 33.6 g/dL (ref 32.0–36.0)
MCV: 91.5 fL (ref 80.0–100.0)
MONO ABS: 425 {cells}/uL (ref 200–950)
MONOS PCT: 5 %
MPV: 10.4 fL (ref 7.5–12.5)
NEUTROS ABS: 5525 {cells}/uL (ref 1500–7800)
Neutrophils Relative %: 65 %
PLATELETS: 246 10*3/uL (ref 140–400)
RBC: 5.4 MIL/uL (ref 4.20–5.80)
RDW: 13.6 % (ref 11.0–15.0)
WBC: 8.5 10*3/uL (ref 3.8–10.8)

## 2016-01-05 LAB — LIPID PANEL
Cholesterol: 194 mg/dL (ref 125–200)
HDL: 18 mg/dL — AB (ref >=40)
TRIGLYCERIDES: 546 mg/dL — AB (ref <150)
Total CHOL/HDL Ratio: 10.8 Ratio — ABNORMAL HIGH (ref <=5.0)

## 2016-01-05 LAB — COMPLETE METABOLIC PANEL WITH GFR
ALBUMIN: 4.1 g/dL (ref 3.6–5.1)
ALK PHOS: 73 U/L (ref 40–115)
ALT: 37 U/L (ref 9–46)
AST: 41 U/L — ABNORMAL HIGH (ref 10–40)
BILIRUBIN TOTAL: 0.9 mg/dL (ref 0.2–1.2)
BUN: 12 mg/dL (ref 7–25)
CALCIUM: 9.1 mg/dL (ref 8.6–10.3)
CO2: 22 mmol/L (ref 20–31)
CREATININE: 1.01 mg/dL (ref 0.60–1.35)
Chloride: 97 mmol/L — ABNORMAL LOW (ref 98–110)
Glucose, Bld: 372 mg/dL — ABNORMAL HIGH (ref 70–99)
Potassium: 4.8 mmol/L (ref 3.5–5.3)
Sodium: 135 mmol/L (ref 135–146)
TOTAL PROTEIN: 7.4 g/dL (ref 6.1–8.1)

## 2016-01-05 LAB — TSH: TSH: 3.18 m[IU]/L (ref 0.40–4.50)

## 2016-01-05 MED ORDER — FREESTYLE SYSTEM KIT: 1.0000 | PACK | Status: DC | PRN

## 2016-01-05 MED ORDER — GLUCOSE BLOOD VI STRP: ORAL_STRIP | Status: DC

## 2016-01-05 MED ORDER — FREESTYLE LANCETS MISC: Status: DC

## 2016-01-05 MED ORDER — METFORMIN HCL 1000 MG PO TABS: 1000.0000 mg | ORAL_TABLET | Freq: Two times a day (BID) | ORAL | Status: DC

## 2016-01-05 MED ORDER — GLIPIZIDE ER 10 MG PO TB24: 10.0000 mg | ORAL_TABLET | Freq: Every day | ORAL | Status: DC

## 2016-01-05 NOTE — Telephone Encounter (Signed)
Pt called back and strips and lancets were sent over to pharm

## 2016-01-05 NOTE — Progress Notes (Signed)
   Subjective:    Patient ID: Samuel Sellers, male    DOB: 04/29/1976, 40 y.o.   MRN: [id number]  HPI Over the last 2 weeks, the patient reports polyuria, polydipsia, and blurred vision. He checked his blood sugar last evening 3 hours after supper and found to be greater than 400. He checked his fasting blood sugar this morning and found to be greater than 260. 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 He is also knowledgeable regarding how to check blood sugar and also signs and symptoms of hypoglycemia. Past Medical History  Diagnosis Date  . Pilonidal cyst    Past Surgical History  Procedure Laterality Date  . Tonsillectomy    . Pilonidal cyst excision  03/15/11   No current outpatient prescriptions on file prior to visit.   No current facility-administered medications on file prior to visit.   Allergies  Allergen Reactions  . Erythromycin    Social History   Social History  . Marital Status: Divorced    Spouse Name: N/A  . Number of Children: N/A  . Years of Education: N/A   Occupational History  . Not on file.   Social History Main Topics  . Smoking status: Former Games developermoker  . Smokeless tobacco: Current User  . Alcohol Use: 0.0 oz/week    6-12 Cans of beer per week  . Drug Use: No  . Sexual Activity: Not on file   Other Topics Concern  . Not on file   Social History Narrative      Review of Systems  All other systems reviewed and are negative.      Objective:   Physical Exam  Cardiovascular: Normal rate, regular rhythm and normal heart sounds.   Pulmonary/Chest: Effort normal and breath sounds normal. No respiratory distress. He has no wheezes. He has no rales.  Abdominal: Soft. Bowel sounds are normal. He exhibits no distension. There is no tenderness. There is no rebound.  Musculoskeletal: He exhibits no edema.  Vitals reviewed.         Assessment & Plan:  Hyperglycemia - Plan: CBC with Differential/Platelet,  COMPLETE METABOLIC PANEL WITH GFR, Lipid panel, Hemoglobin A1c, metFORMIN (GLUCOPHAGE) 1000 MG tablet, glipiZIDE (GLIPIZIDE XL) 10 MG 24 hr tablet  Obesity - Plan: CBC with Differential/Platelet, COMPLETE METABOLIC PANEL WITH GFR, Lipid panel, Hemoglobin A1c  Tachycardia - Plan: TSH  Spent 25 minutes discussing the diagnosis. I suspect his hemoglobin A1c will be greater than 10. Begin metformin 1000 mg by mouth twice a day and glipizide extended release 10 mg by mouth every morning. Start checking fasting blood sugars and two-hour postprandial sugars every day recheck with me in 2 weeks or immediately if worse. Discussed low carbohydrate diet increasing fluid consumption. I would like him to consume less than 45 g of carbs per meal. Recheck in 2 weeks

## 2016-01-05 NOTE — Addendum Note (Signed)
Addended by: Legrand RamsWILLIS, Chimene Salo B on: 01/05/2016 12:10 PM   Modules accepted: Orders

## 2016-01-05 NOTE — Telephone Encounter (Signed)
Pt is at the pharmacy to pick up his glucose meter but the pharmacy did not receive a request for test strips or lancets.  Please call in to [facility]

## 2016-01-05 NOTE — Addendum Note (Signed)
Addended by: Legrand RamsWILLIS, SANDY B on: 01/05/2016 12:04 PM   Modules accepted: Orders

## 2016-01-06 LAB — HEMOGLOBIN A1C
Hgb A1c MFr Bld: 11 % — ABNORMAL HIGH (ref <5.7)
Mean Plasma Glucose: 269 mg/dL

## 2016-01-22 ENCOUNTER — Ambulatory Visit (INDEPENDENT_AMBULATORY_CARE_PROVIDER_SITE_OTHER): Payer: 59 | Admitting: Family Medicine

## 2016-01-22 ENCOUNTER — Encounter: Payer: Self-pay | Admitting: Family Medicine

## 2016-01-22 VITALS — BP 128/78 | HR 72 | Temp 98.9°F | Resp 14 | Ht 77.0 in | Wt 313.0 lb

## 2016-01-22 DIAGNOSIS — E1100 Type 2 diabetes mellitus with hyperosmolarity without nonketotic hyperglycemic-hyperosmolar coma (NKHHC): Secondary | ICD-10-CM | POA: Diagnosis not present

## 2016-01-22 NOTE — Progress Notes (Signed)
Subjective:    Patient ID: Samuel Sellers, male    DOB: Sep 01, 1975, 40 y.o.   MRN: [id number]  HPI 01/05/16 Over the last 2 weeks, the patient reports polyuria, polydipsia, and blurred vision. He checked his blood sugar last evening 3 hours after supper and found to be greater than 400. He checked his fasting blood sugar this morning and found to be greater than 260. 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 He is also knowledgeable regarding how to check blood sugar and also signs and symptoms of hypoglycemia.  At that time, my plan was: Spent 25 minutes discussing the diagnosis. I suspect his hemoglobin A1c will be greater than 10. Begin metformin 1000 mg by mouth twice a day and glipizide extended release 10 mg by mouth every morning. Start checking fasting blood sugars and two-hour postprandial sugars every day recheck with me in 2 weeks or immediately if worse. Discussed low carbohydrate diet increasing fluid consumption. I would like him to consume less than 45 g of carbs per meal. Recheck in 2 weeks  01/22/16 Sugars have dropped drastically. His fasting blood sugars are now between 60 and 100 and morning. More often they are below 70. He has drastically changed his diet. Postprandial sugars are under 130. Sometimes he is symptomatic due to hypoglycemia. He has not taken aspirin. He is not on an ACE inhibitor. Most recent lab work as listed below: Office Visit on 01/05/2016  Component Date Value Ref Range Status  . WBC 01/05/2016 8.5  3.8 - 10.8 K/uL Final  . RBC 01/05/2016 5.40  4.20 - 5.80 MIL/uL Final  . Hemoglobin 01/05/2016 16.6  13.0 - 17.0 g/dL Final  . HCT 01/05/2016 49.4  38.5 - 50.0 % Final  . MCV 01/05/2016 91.5  80.0 - 100.0 fL Final  . MCH 01/05/2016 30.7  27.0 - 33.0 pg Final  . MCHC 01/05/2016 33.6  32.0 - 36.0 g/dL Final  . RDW 01/05/2016 13.6  11.0 - 15.0 % Final  . Platelets 01/05/2016 246  140 - 400 K/uL Final  . MPV 01/05/2016 10.4  7.5 -  12.5 fL Final  . Neutro Abs 01/05/2016 5525  1500 - 7800 cells/uL Final  . Lymphs Abs 01/05/2016 2380  850 - 3900 cells/uL Final  . Monocytes Absolute 01/05/2016 425  200 - 950 cells/uL Final  . Eosinophils Absolute 01/05/2016 85  15 - 500 cells/uL Final  . Basophils Absolute 01/05/2016 85  0 - 200 cells/uL Final  . Neutrophils Relative % 01/05/2016 65   Final  . Lymphocytes Relative 01/05/2016 28   Final  . Monocytes Relative 01/05/2016 5   Final  . Eosinophils Relative 01/05/2016 1   Final  . Basophils Relative 01/05/2016 1   Final  . Smear Review 01/05/2016 Criteria for review not met   Final   ** Please note change in unit of measure and reference range(s). **  . Sodium 01/05/2016 135  135 - 146 mmol/L Final  . Potassium 01/05/2016 4.8  3.5 - 5.3 mmol/L Final  . Chloride 01/05/2016 97* 98 - 110 mmol/L Final  . CO2 01/05/2016 22  20 - 31 mmol/L Final  . Glucose, Bld 01/05/2016 372* 70 - 99 mg/dL Final  . BUN 01/05/2016 12  7 - 25 mg/dL Final  . Creat 01/05/2016 1.01  0.60 - 1.35 mg/dL Final  . Total Bilirubin 01/05/2016 0.9  0.2 - 1.2 mg/dL Final  . Alkaline Phosphatase 01/05/2016 73  40 - 115  U/L Final  . AST 01/05/2016 41* 10 - 40 U/L Final  . ALT 01/05/2016 37  9 - 46 U/L Final  . Total Protein 01/05/2016 7.4  6.1 - 8.1 g/dL Final  . Albumin 01/05/2016 4.1  3.6 - 5.1 g/dL Final  . Calcium 01/05/2016 9.1  8.6 - 10.3 mg/dL Final  . GFR, Est African American 01/05/2016 >89  >=60 mL/min Final  . GFR, Est Non African American 01/05/2016 >89  >=60 mL/min Final   Comment:   The estimated GFR is a calculation valid for adults (>=25 years old) that uses the CKD-EPI algorithm to adjust for age and sex. It is   not to be used for children, pregnant women, hospitalized patients,    patients on dialysis, or with rapidly changing kidney function. According to the NKDEP, eGFR >89 is normal, 60-89 shows mild impairment, 30-59 shows moderate impairment, 15-29 shows severe impairment and <15  is ESRD.     Marland Kitchen Cholesterol 01/05/2016 194  125 - 200 mg/dL Final  . Triglycerides 01/05/2016 546* <150 mg/dL Final  . HDL 01/05/2016 18* >=40 mg/dL Final  . Total CHOL/HDL Ratio 01/05/2016 10.8* <=5.0 Ratio Final  . VLDL 01/05/2016 NOT CALC  <30 mg/dL Final   Comment:   Not calculated due to Triglyceride >400. Suggest ordering Direct LDL (Unit Code: [phone]).   . LDL Cholesterol 01/05/2016 NOT CALC  <130 mg/dL Final   Comment:   Not calculated due to Triglyceride >400. Suggest ordering Direct LDL (Unit Code: [phone]).   Total Cholesterol/HDL Ratio:CHD Risk                        Coronary Heart Disease Risk Table                                        Men       Women          1/2 Average Risk              3.4        3.3              Average Risk              5.0        4.4           2X Average Risk              9.6        7.1           3X Average Risk             23.4       11.0 Use the calculated Patient Ratio above and the CHD Risk table  to determine the patient's CHD Risk.   . Hgb A1c MFr Bld 01/05/2016 11.0* <5.7 % Final   Comment:   For someone without known diabetes, a hemoglobin A1c value of 6.5% or greater indicates that they may have diabetes and this should be confirmed with a follow-up test.   For someone with known diabetes, a value <7% indicates that their diabetes is well controlled and a value greater than or equal to 7% indicates suboptimal control. A1c targets should be individualized based on duration of diabetes, age, comorbid conditions, and other considerations.   Currently, no consensus exists for use of hemoglobin A1c for diagnosis  of diabetes for children.     . Mean Plasma Glucose 01/05/2016 269   Final  . TSH 01/05/2016 3.18  0.40 - 4.50 mIU/L Final    Past Medical History  Diagnosis Date  . Pilonidal cyst    Past Surgical History  Procedure Laterality Date  . Tonsillectomy    . Pilonidal cyst excision  03/15/11   Current Outpatient  Prescriptions on File Prior to Visit  Medication Sig Dispense Refill  . glipiZIDE (GLIPIZIDE XL) 10 MG 24 hr tablet Take 1 tablet (10 mg total) by mouth daily with breakfast. 30 tablet 5  . glucose blood (FREESTYLE LITE) test strip Check BS bid 100 each 12  . glucose monitoring kit (FREESTYLE) monitoring kit 1 each by Does not apply route as needed for other. 1 each 3  . Lancets (FREESTYLE) lancets Check BS bid 100 each 12  . metFORMIN (GLUCOPHAGE) 1000 MG tablet Take 1 tablet (1,000 mg total) by mouth 2 (two) times daily with a meal. 180 tablet 3   No current facility-administered medications on file prior to visit.   Allergies  Allergen Reactions  . Erythromycin    Social History   Social History  . Marital Status: Divorced    Spouse Name: N/A  . Number of Children: N/A  . Years of Education: N/A   Occupational History  . Not on file.   Social History Main Topics  . Smoking status: Former Research scientist (life sciences)  . Smokeless tobacco: Current User  . Alcohol Use: 0.0 oz/week    6-12 Cans of beer per week  . Drug Use: No  . Sexual Activity: Not on file   Other Topics Concern  . Not on file   Social History Narrative      Review of Systems  All other systems reviewed and are negative.      Objective:   Physical Exam  Cardiovascular: Normal rate, regular rhythm and normal heart sounds.   Pulmonary/Chest: Effort normal and breath sounds normal. No respiratory distress. He has no wheezes. He has no rales.  Abdominal: Soft. Bowel sounds are normal. He exhibits no distension. There is no tenderness. There is no rebound.  Musculoskeletal: He exhibits no edema.  Vitals reviewed.         Assessment & Plan:  Uncontrolled type 2 diabetes mellitus with hyperosmolarity without coma, without long-term current use of insulin ([facility])  Sugars now sound well controlled. In fact I'm concerned he may be overmedicated. Discontinue glipizide. Continue metformin. Recheck in September. At that  time I will repeat a hemoglobin A1c. He is working on diet exercise and weight loss. He would like to recheck a fasting lipid panel in September after he has controlled his sugars. At that time if his triglycerides are still significantly high and his HDL is still low we may want to start medication to address this. I would check a microalbumin in September after he has had 3 months to manage his sugars. I recommended a diabetic eye exam. If he has an elevated microalbumin in 3 months, we will add an ACE inhibitor. Recommend he start taking aspirin 81 mg daily.

## 2016-04-30 ENCOUNTER — Ambulatory Visit (INDEPENDENT_AMBULATORY_CARE_PROVIDER_SITE_OTHER): Payer: 59 | Admitting: Family Medicine

## 2016-04-30 ENCOUNTER — Encounter: Payer: Self-pay | Admitting: Family Medicine

## 2016-04-30 VITALS — BP 126/78 | HR 64 | Temp 98.4°F | Resp 18 | Ht 77.5 in | Wt 293.0 lb

## 2016-04-30 DIAGNOSIS — E669 Obesity, unspecified: Secondary | ICD-10-CM

## 2016-04-30 DIAGNOSIS — E1100 Type 2 diabetes mellitus with hyperosmolarity without nonketotic hyperglycemic-hyperosmolar coma (NKHHC): Secondary | ICD-10-CM

## 2016-04-30 DIAGNOSIS — E785 Hyperlipidemia, unspecified: Secondary | ICD-10-CM | POA: Diagnosis not present

## 2016-04-30 DIAGNOSIS — Z23 Encounter for immunization: Secondary | ICD-10-CM

## 2016-04-30 LAB — CBC WITH DIFFERENTIAL/PLATELET
BASOS ABS: 65 {cells}/uL (ref 0–200)
BASOS PCT: 1 %
EOS PCT: 1 %
Eosinophils Absolute: 65 cells/uL (ref 15–500)
HCT: 51.2 % — ABNORMAL HIGH (ref 38.5–50.0)
HEMOGLOBIN: 17.3 g/dL — AB (ref 13.0–17.0)
LYMPHS ABS: 2015 {cells}/uL (ref 850–3900)
Lymphocytes Relative: 31 %
MCH: 31.3 pg (ref 27.0–33.0)
MCHC: 33.8 g/dL (ref 32.0–36.0)
MCV: 92.6 fL (ref 80.0–100.0)
MONOS PCT: 9 %
MPV: 10 fL (ref 7.5–12.5)
Monocytes Absolute: 585 cells/uL (ref 200–950)
NEUTROS ABS: 3770 {cells}/uL (ref 1500–7800)
Neutrophils Relative %: 58 %
PLATELETS: 200 10*3/uL (ref 140–400)
RBC: 5.53 MIL/uL (ref 4.20–5.80)
RDW: 13.8 % (ref 11.0–15.0)
WBC: 6.5 10*3/uL (ref 3.8–10.8)

## 2016-04-30 LAB — COMPLETE METABOLIC PANEL WITH GFR
ALBUMIN: 4.4 g/dL (ref 3.6–5.1)
ALK PHOS: 45 U/L (ref 40–115)
ALT: 20 U/L (ref 9–46)
AST: 21 U/L (ref 10–40)
BILIRUBIN TOTAL: 1 mg/dL (ref 0.2–1.2)
BUN: 16 mg/dL (ref 7–25)
CO2: 26 mmol/L (ref 20–31)
CREATININE: 0.93 mg/dL (ref 0.60–1.35)
Calcium: 9.2 mg/dL (ref 8.6–10.3)
Chloride: 104 mmol/L (ref 98–110)
GFR, Est African American: >89 mL/min (ref >=60)
GLUCOSE: 87 mg/dL (ref 70–99)
Potassium: 4.5 mmol/L (ref 3.5–5.3)
SODIUM: 141 mmol/L (ref 135–146)
TOTAL PROTEIN: 7.4 g/dL (ref 6.1–8.1)

## 2016-04-30 LAB — LIPID PANEL
Cholesterol: 196 mg/dL (ref 125–200)
HDL: 24 mg/dL — ABNORMAL LOW (ref >=40)
LDL CALC: 129 mg/dL (ref <130)
Total CHOL/HDL Ratio: 8.2 Ratio — ABNORMAL HIGH (ref <=5.0)
Triglycerides: 216 mg/dL — ABNORMAL HIGH (ref <150)
VLDL: 43 mg/dL — ABNORMAL HIGH (ref <30)

## 2016-04-30 NOTE — Addendum Note (Signed)
Addended by: Legrand RamsWILLIS, Adrick Kestler B on: 04/30/2016 09:22 AM   Modules accepted: Orders

## 2016-04-30 NOTE — Progress Notes (Signed)
Subjective:    Patient ID: Samuel Sellers, male    DOB: [birth year], 40 y.o.   MRN: [id number]  HPI5/15/17 Over the last 2 weeks, the patient reports polyuria, polydipsia, and blurred vision. He checked his blood sugar last evening 3 hours after supper and found to be greater than 400. He checked his fasting blood sugar this morning and found to be greater than 260. 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 He is also knowledgeable regarding how to check blood sugar and also signs and symptoms of hypoglycemia.  At that time, my plan was: Spent 25 minutes discussing the diagnosis. I suspect his hemoglobin A1c will be greater than 10. Begin metformin 1000 mg by mouth twice a day and glipizide extended release 10 mg by mouth every morning. Start checking fasting blood sugars and two-hour postprandial sugars every day recheck with me in 2 weeks or immediately if worse. Discussed low carbohydrate diet increasing fluid consumption. I would like him to consume less than 45 g of carbs per meal. Recheck in 2 weeks  01/22/16 Sugars have dropped drastically. His fasting blood sugars are now between 60 and 100 and morning. More often they are below 70. He has drastically changed his diet. Postprandial sugars are under 130. Sometimes he is symptomatic due to hypoglycemia. He has not taken aspirin. He is not on an ACE inhibitor. Most recent lab work as listed below:  At that time, my plan was: Sugars now sound well controlled. In fact I'm concerned he may be overmedicated. Discontinue glipizide. Continue metformin. Recheck in September. At that time I will repeat a hemoglobin A1c. He is working on diet exercise and weight loss. He would like to recheck a fasting lipid panel in September after he has controlled his sugars. At that time if his triglycerides are still significantly high and his HDL is still low we may want to start medication to address this. I would check a microalbumin in  September after he has had 3 months to manage his sugars. I recommended a diabetic eye exam. If he has an elevated microalbumin in 3 months, we will add an ACE inhibitor. Recommend he start taking aspirin 81 mg daily.  04/30/16 Here for recheck.  He has been doing very well.  His fasting blood sugars are always below 100.  He states that his two-hour postprandial sugars are typically below 100. He is also having episodes of hypoglycemia. Metformin 1000 twice daily is giving him diarrhea. 2 days ago he discontinued metformin. He has not yet seen any substantial change in his sugars. He is drastically altered his lifestyle. Wt Readings from Last 3 Encounters:  04/30/16 293 lb (132.9 kg)  01/22/16 (!) 313 lb (142 kg)  01/05/16 (!) 320 lb (145.2 kg)   As you can see above, the patient has lost 27 pounds and his dramatically reduced his sugar consumption. He is interested in staying off metformin if possible. His blood pressure today is well controlled. He had a diabetic eye exam the beginning of the year. He is due to recheck his cholesterol. He declines a flu shot.   Past Medical History:  Diagnosis Date  . Pilonidal cyst    Past Surgical History:  Procedure Laterality Date  . PILONIDAL CYST EXCISION  03/15/11  . TONSILLECTOMY     Current Outpatient Prescriptions on File Prior to Visit  Medication Sig Dispense Refill  . glipiZIDE (GLIPIZIDE XL) 10 MG 24 hr tablet Take 1 tablet (10 mg  total) by mouth daily with breakfast. 30 tablet 5  . glucose blood (FREESTYLE LITE) test strip Check BS bid 100 each 12  . glucose monitoring kit (FREESTYLE) monitoring kit 1 each by Does not apply route as needed for other. 1 each 3  . Lancets (FREESTYLE) lancets Check BS bid 100 each 12  . metFORMIN (GLUCOPHAGE) 1000 MG tablet Take 1 tablet (1,000 mg total) by mouth 2 (two) times daily with a meal. 180 tablet 3  . minocycline (DYNACIN) 100 MG tablet Take 100 mg by mouth 2 (two) times daily.     No current  facility-administered medications on file prior to visit.    Allergies  Allergen Reactions  . Erythromycin    Social History   Social History  . Marital status: Divorced    Spouse name: N/A  . Number of children: N/A  . Years of education: N/A   Occupational History  . Not on file.   Social History Main Topics  . Smoking status: Former Research scientist (life sciences)  . Smokeless tobacco: Current User  . Alcohol use 0.0 oz/week    6 - 12 Cans of beer per week  . Drug use: No  . Sexual activity: Not on file   Other Topics Concern  . Not on file   Social History Narrative  . No narrative on file      Review of Systems  All other systems reviewed and are negative.      Objective:   Physical Exam  Cardiovascular: Normal rate, regular rhythm and normal heart sounds.   Pulmonary/Chest: Effort normal and breath sounds normal. No respiratory distress. He has no wheezes. He has no rales.  Abdominal: Soft. Bowel sounds are normal. He exhibits no distension. There is no tenderness. There is no rebound.  Musculoskeletal: He exhibits no edema.  Vitals reviewed.         Assessment & Plan:  Uncontrolled type 2 diabetes mellitus with hyperosmolarity without coma, without long-term current use of insulin ([place]) - Plan: CBC with Differential/Platelet, COMPLETE METABOLIC PANEL WITH GFR, Lipid panel, Microalbumin, urine, Hemoglobin A1c  Obesity  HLD (hyperlipidemia)  Blood pressures well controlled. Sugars the patient is recording sound well controlled. I will check a hemoglobin A1c. If less than 6 we will discontinue metformin altogether. If 6.5 I would recommend reducing metformin 500 mg twice a day. Check fasting lipid panel. Goal LDL cholesterol is less than 100. I'm very proud of the patient for his weight loss. Recommend a flu shot but he politely declined. I will give him Pneumovax 23. Diabetic foot exam is normal

## 2016-05-01 LAB — MICROALBUMIN, URINE: Microalb, Ur: 0.6 mg/dL

## 2016-05-01 LAB — HEMOGLOBIN A1C
HEMOGLOBIN A1C: 5.4 % (ref <5.7)
MEAN PLASMA GLUCOSE: 108 mg/dL

## 2016-05-03 ENCOUNTER — Other Ambulatory Visit: Payer: Self-pay | Admitting: Family Medicine

## 2016-05-03 DIAGNOSIS — R739 Hyperglycemia, unspecified: Secondary | ICD-10-CM

## 2016-05-05 ENCOUNTER — Other Ambulatory Visit: Payer: Self-pay | Admitting: Family Medicine

## 2016-05-05 MED ORDER — ATORVASTATIN CALCIUM 20 MG PO TABS: 20.0000 mg | ORAL_TABLET | Freq: Every day | ORAL | 1 refills | Status: DC

## 2016-08-27 ENCOUNTER — Encounter: Payer: Self-pay | Admitting: Physician Assistant

## 2016-08-27 ENCOUNTER — Ambulatory Visit (INDEPENDENT_AMBULATORY_CARE_PROVIDER_SITE_OTHER): Payer: 59 | Admitting: Family Medicine

## 2016-08-27 ENCOUNTER — Encounter: Payer: Self-pay | Admitting: Family Medicine

## 2016-08-27 VITALS — BP 134/84 | HR 80 | Temp 98.9°F | Resp 18 | Ht 77.5 in | Wt 304.0 lb

## 2016-08-27 DIAGNOSIS — J019 Acute sinusitis, unspecified: Secondary | ICD-10-CM | POA: Diagnosis not present

## 2016-08-27 MED ORDER — AMOXICILLIN 875 MG PO TABS: 875.0000 mg | ORAL_TABLET | Freq: Two times a day (BID) | ORAL | 0 refills | Status: DC

## 2016-08-27 NOTE — Progress Notes (Signed)
   Subjective:    Patient ID: Samuel Sellers, male    DOB: 1975-09-05, 41 y.o.   MRN: [id number]  HPI   Symptoms began 48 hours ago. Symptoms consist of head congestion and rhinorrhea postnasal drip pressure in his frontal sinuses, scratchy throat, and cough due to postnasal drip. He denies any severe sinus pain. He denies any fevers. His girlfriend's daughter has the exact same symptoms. Past Medical History:  Diagnosis Date  . Pilonidal cyst    Past Surgical History:  Procedure Laterality Date  . PILONIDAL CYST EXCISION  03/15/11  . TONSILLECTOMY     Current Outpatient Prescriptions on File Prior to Visit  Medication Sig Dispense Refill  . atorvastatin (LIPITOR) 20 MG tablet Take 1 tablet (20 mg total) by mouth daily. 90 tablet 1  . glucose blood (FREESTYLE LITE) test strip Check BS bid 100 each 12  . glucose monitoring kit (FREESTYLE) monitoring kit 1 each by Does not apply route as needed for other. 1 each 3  . Lancets (FREESTYLE) lancets Check BS bid 100 each 12  . minocycline (DYNACIN) 100 MG tablet Take 100 mg by mouth 2 (two) times daily.     No current facility-administered medications on file prior to visit.    Allergies  Allergen Reactions  . Erythromycin    Social History   Social History  . Marital status: Divorced    Spouse name: N/A  . Number of children: N/A  . Years of education: N/A   Occupational History  . Not on file.   Social History Main Topics  . Smoking status: Former Research scientist (life sciences)  . Smokeless tobacco: Current User  . Alcohol use 0.0 oz/week    6 - 12 Cans of beer per week  . Drug use: No  . Sexual activity: Not on file   Other Topics Concern  . Not on file   Social History Narrative  . No narrative on file     Review of Systems  All other systems reviewed and are negative.      Objective:   Physical Exam  HENT:  Right Ear: External ear normal.  Left Ear: External ear normal.  Nose: Mucosal edema and rhinorrhea present. Right  sinus exhibits frontal sinus tenderness. Left sinus exhibits frontal sinus tenderness.  Mouth/Throat: Oropharynx is clear and moist.  Eyes: Conjunctivae are normal. Pupils are equal, round, and reactive to light.  Neck: Neck supple.  Cardiovascular: Normal rate, regular rhythm and normal heart sounds.   Pulmonary/Chest: Effort normal and breath sounds normal. No respiratory distress. He has no wheezes. He has no rales.  Lymphadenopathy:    He has no cervical adenopathy.  Vitals reviewed.         Assessment & Plan:  Acute rhinosinusitis - Plan: amoxicillin (AMOXIL) 875 MG tablet  Patient has a viral upper respiratory infection/rhinosinusitis. I explained that 90% of these are viruses and improve in 7-10 days with symptom management alone. Therefore I recommended norel 1 tablet every 4-6 hours as needed for head congestion. I recommended tincture of time. Should his symptoms last more than 10 days or should he develop a very high fever with severe sinus pain, I did give him a prescription for amoxicillin 875 mg by mouth twice a day for 10 days with strict instructions not to fill unless he meets the criteri

## 2016-11-01 DIAGNOSIS — H04123 Dry eye syndrome of bilateral lacrimal glands: Secondary | ICD-10-CM | POA: Diagnosis not present

## 2016-12-27 DIAGNOSIS — H16213 Exposure keratoconjunctivitis, bilateral: Secondary | ICD-10-CM | POA: Diagnosis not present

## 2017-04-02 DIAGNOSIS — T148XXA Other injury of unspecified body region, initial encounter: Secondary | ICD-10-CM | POA: Diagnosis not present

## 2017-04-02 DIAGNOSIS — L089 Local infection of the skin and subcutaneous tissue, unspecified: Secondary | ICD-10-CM | POA: Diagnosis not present

## 2017-04-05 ENCOUNTER — Encounter: Payer: Self-pay | Admitting: Family Medicine

## 2017-04-05 ENCOUNTER — Ambulatory Visit (INDEPENDENT_AMBULATORY_CARE_PROVIDER_SITE_OTHER): Payer: 59 | Admitting: Family Medicine

## 2017-04-05 VITALS — BP 136/70 | HR 94 | Temp 98.8°F | Resp 16 | Ht 72.5 in | Wt 295.0 lb

## 2017-04-05 DIAGNOSIS — L02511 Cutaneous abscess of right hand: Secondary | ICD-10-CM | POA: Diagnosis not present

## 2017-04-05 DIAGNOSIS — Z23 Encounter for immunization: Secondary | ICD-10-CM | POA: Diagnosis not present

## 2017-04-05 DIAGNOSIS — L03011 Cellulitis of right finger: Secondary | ICD-10-CM

## 2017-04-05 MED ORDER — SULFAMETHOXAZOLE-TRIMETHOPRIM 800-160 MG PO TABS: 1.0000 | ORAL_TABLET | Freq: Two times a day (BID) | ORAL | 0 refills | Status: DC

## 2017-04-05 MED ORDER — TRAMADOL HCL 50 MG PO TABS: 50.0000 mg | ORAL_TABLET | Freq: Four times a day (QID) | ORAL | 0 refills | Status: DC | PRN

## 2017-04-05 NOTE — Patient Instructions (Addendum)
Tetanus Booster given  Start the bactrim ANTIBIOTICS Use ultram for pain F/U Friday FOR RECHECK of hand

## 2017-04-05 NOTE — Progress Notes (Signed)
   Subjective:    Patient ID: Samuel PopperRichard C Sellers, male    DOB: [birth year], 41 y.o.   MRN: [id number]  Patient presents for Infection (x7 days- open area to R 5th digit- was out trimming limbs and noted boil like area- warm compress and self I&D- [facility])   [facility] due to infection or right 5th digit  Was trimming limbs in yard not sure if debri caused laceration or if he was bitten by something. 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 [facility] giv[facility] bacitracin , keflex, ibuprofen , but told not to start antibiotics unless it worsened, he picked up antibiotic yesterday has taken 1 dose. No fever,no chills    Review Of Systems:  GEN- denies fatigue, fever, weight loss,weakness, recent illness HEENT- denies eye drainage, change in vision, nasal discharge, CVS- denies chest pain, palpitations RESP- denies SOB, cough, wheeze MSK-+ joint pain, muscle aches, injury Neuro- denies headache, dizziness, syncope, seizure activity       Objective:    BP 136/70   Pulse 94   Temp 98.8 F (37.1 C) (Oral)   Resp 16   Ht 6' 0.5" (1.842 m)   Wt 295 lb (133.8 kg)   SpO2 96%   BMI 39.46 kg/m  GEN- NAD, alert and oriented x3 Right hand- 5th digit, dime size lesion with yellow pus/tissue at center, no necrosis surrounded with 1-2 cm of erythema and mild peeling of skin below MIP, TTP, swelling along joint to base of finger, decreased flexion at MIP ,unable to make full fist, unable to grasp normally  Pulse- 2+, normal cap refill  Procedure- Incision and Drainage Procedure explained to patient questions answered benefits and risks discussed verbal consent obtained. Antiseptic-none as open wound  Anesthesia-lidocaine 1% no epi Small incision through yellow yellow granulation tissue, some removed, pus drained beneath  Culture taken Minimal blood loss Patient tolerated  procedure well, not deep enough to pack NOT to TENDONS Bandage applied        Assessment & Plan:      Problem List Items Addressed This Visit    None    Visit Diagnoses    Abscess of right little finger    -  Primary   unclear cause, drained in office, change to bactrim DS, keep clean, no peroxide to wound. 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 Any transcriptional errors that result from this process are unintentional.

## 2017-04-07 ENCOUNTER — Encounter: Payer: Self-pay | Admitting: Family Medicine

## 2017-04-08 ENCOUNTER — Ambulatory Visit (INDEPENDENT_AMBULATORY_CARE_PROVIDER_SITE_OTHER): Payer: 59 | Admitting: Family Medicine

## 2017-04-08 ENCOUNTER — Encounter: Payer: Self-pay | Admitting: Family Medicine

## 2017-04-08 VITALS — BP 122/68 | HR 90 | Temp 98.8°F | Resp 16 | Ht 73.0 in | Wt 295.2 lb

## 2017-04-08 DIAGNOSIS — S61209D Unspecified open wound of unspecified finger without damage to nail, subsequent encounter: Secondary | ICD-10-CM

## 2017-04-08 DIAGNOSIS — L02511 Cutaneous abscess of right hand: Secondary | ICD-10-CM | POA: Diagnosis not present

## 2017-04-08 LAB — WOUND CULTURE
GRAM STAIN: NONE SEEN
Gram Stain: NONE SEEN

## 2017-04-08 NOTE — Patient Instructions (Signed)
Referral to hand center of [facility]  Continue antibiotics  F/U as needed

## 2017-04-08 NOTE — Progress Notes (Signed)
   Subjective:    Patient ID: Samuel Sellers, male    DOB: 08-Nov-1975, 41 y.o.   MRN: [id number]  Patient presents for follow up with right finger Pt here  follow-up on abscess of his right fifth digit along with cellulitis. Her last visit she did a small incision and drainage culture was sent which currently shows staph aureus he is currently on Bactrim which is also covering for MRSA. The swelling has gone down but he still has yellow granulation tissue at the center he has had minimal drainage. 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 He has not had any fever no chills. He is able to bend the finger since the swelling has gone down.   Review Of Systems:  GEN- denies fatigue, fever, weight loss,weakness, recent illness HEENT- denies eye drainage, change in vision, nasal discharge, CVS- denies chest pain, palpitations RESP- denies SOB, cough, wheeze MSK- +joint pain, muscle aches, injury Neuro- denies headache, dizziness, syncope, seizure activity       Objective:    BP 122/68   Pulse 90   Temp 98.8 F (37.1 C) (Oral)   Resp 16   Ht 6\' 1"  (1.854 m)   Wt 295 lb 3.2 oz (133.9 kg)   SpO2 97%   BMI 38.95 kg/m  GEN- NAD, alert and oriented x3 Right hand- 5th digit, dime size lesion with yellow granulation tissue at center, no pus, no odor,  no necrosis surrounded with 1  cm of erythema and mild peeling of skin below MIP, TTP, decreased swelling along joint to base of finger,unable to make full fist, unable to grasp normally  Pulse- 2+, normal cap refill         Assessment & Plan:      Problem List Items Addressed This Visit    None    Visit Diagnoses    Abscess of right little finger    -  Primary   Removed the yellow tissue which was already coming out, has pink granulation beneath but fairly moderate size whole from wound, Pain has improved and cellulitis Is much improved. We discussed her options as he does a lot with his hands on his job. We'll go ahead and  send him to hand surgery to have them evaluate see if it needs further debridement I'm concerned that the wound is not an approximate well with healing based on the location appearance. I've taken him out of work for Monday and Tuesday case he does need surgical intervention.  Continue the Bactrim    Relevant Orders   Ambulatory referral to Hand Surgery   Open wound of finger, subsequent encounter       Relevant Orders   Ambulatory referral to Hand Surgery      Note: This dictation was prepared with Dragon dictation along with smaller phrase technology. Any transcriptional errors that result from this process are unintentional.

## 2017-04-11 DIAGNOSIS — L02511 Cutaneous abscess of right hand: Secondary | ICD-10-CM | POA: Diagnosis not present

## 2017-08-22 ENCOUNTER — Encounter: Payer: Self-pay | Admitting: Physician Assistant

## 2017-09-19 ENCOUNTER — Encounter: Payer: Self-pay | Admitting: Physician Assistant

## 2017-11-24 DIAGNOSIS — M9903 Segmental and somatic dysfunction of lumbar region: Secondary | ICD-10-CM | POA: Diagnosis not present

## 2017-11-29 DIAGNOSIS — M9903 Segmental and somatic dysfunction of lumbar region: Secondary | ICD-10-CM | POA: Diagnosis not present

## 2017-12-01 DIAGNOSIS — M9903 Segmental and somatic dysfunction of lumbar region: Secondary | ICD-10-CM | POA: Diagnosis not present

## 2017-12-06 DIAGNOSIS — M9903 Segmental and somatic dysfunction of lumbar region: Secondary | ICD-10-CM | POA: Diagnosis not present

## 2017-12-08 DIAGNOSIS — M9903 Segmental and somatic dysfunction of lumbar region: Secondary | ICD-10-CM | POA: Diagnosis not present

## 2017-12-14 DIAGNOSIS — M9903 Segmental and somatic dysfunction of lumbar region: Secondary | ICD-10-CM | POA: Diagnosis not present

## 2017-12-26 DIAGNOSIS — M9903 Segmental and somatic dysfunction of lumbar region: Secondary | ICD-10-CM | POA: Diagnosis not present

## 2017-12-28 ENCOUNTER — Encounter (HOSPITAL_BASED_OUTPATIENT_CLINIC_OR_DEPARTMENT_OTHER): Payer: Self-pay | Admitting: Emergency Medicine

## 2017-12-28 ENCOUNTER — Other Ambulatory Visit: Payer: Self-pay

## 2017-12-28 ENCOUNTER — Emergency Department (HOSPITAL_BASED_OUTPATIENT_CLINIC_OR_DEPARTMENT_OTHER)
Admission: EM | Admit: 2017-12-28 | Discharge: 2017-12-29 | Disposition: A | Discharge status: Home / Self Care | Payer: Worker's Compensation | Attending: Emergency Medicine | Admitting: Emergency Medicine

## 2017-12-28 ENCOUNTER — Emergency Department (HOSPITAL_BASED_OUTPATIENT_CLINIC_OR_DEPARTMENT_OTHER): Payer: Worker's Compensation

## 2017-12-28 DIAGNOSIS — X509XXA Other and unspecified overexertion or strenuous movements or postures, initial encounter: Secondary | ICD-10-CM | POA: Diagnosis not present

## 2017-12-28 DIAGNOSIS — Y999 Unspecified external cause status: Secondary | ICD-10-CM | POA: Insufficient documentation

## 2017-12-28 DIAGNOSIS — Y9355 Activity, bike riding: Secondary | ICD-10-CM | POA: Diagnosis not present

## 2017-12-28 DIAGNOSIS — S99921A Unspecified injury of right foot, initial encounter: Secondary | ICD-10-CM | POA: Diagnosis present

## 2017-12-28 DIAGNOSIS — S99191A Other physeal fracture of right metatarsal, initial encounter for closed fracture: Secondary | ICD-10-CM

## 2017-12-28 DIAGNOSIS — Y929 Unspecified place or not applicable: Secondary | ICD-10-CM | POA: Diagnosis not present

## 2017-12-28 DIAGNOSIS — S92354A Nondisplaced fracture of fifth metatarsal bone, right foot, initial encounter for closed fracture: Secondary | ICD-10-CM | POA: Diagnosis not present

## 2017-12-28 DIAGNOSIS — Z87891 Personal history of nicotine dependence: Secondary | ICD-10-CM | POA: Diagnosis not present

## 2017-12-28 MED ORDER — HYDROCODONE-ACETAMINOPHEN 5-325 MG PO TABS
2.0000 | ORAL_TABLET | Freq: Once | ORAL | Status: AC
  Administered 2017-12-28: 2 via ORAL
  Filled 2017-12-28: qty 2

## 2017-12-28 NOTE — ED Triage Notes (Signed)
Pt c/o RT foot pain s/p turning it while getting off of bike

## 2017-12-28 NOTE — ED Provider Notes (Signed)
TIME SEEN: 11:42 PM  CHIEF COMPLAINT: Right foot pain  HPI: Patient is a 42 year old male with no significant past medical history who presents to the emergency department with right foot pain.  States that he inverted it getting off of a bike tonight.  Heard it snap.  Unable to ambulate secondary to pain.  Did not fall to the ground or hit his head.  ROS: See HPI Constitutional: no fever  Eyes: no drainage  ENT: no runny nose   Cardiovascular:  no chest pain  Resp: no SOB  GI: no vomiting GU: no dysuria Integumentary: no rash  Allergy: no hives  Musculoskeletal: no leg swelling  Neurological: no slurred speech ROS otherwise negative  PAST MEDICAL HISTORY/PAST SURGICAL HISTORY:  Past Medical History:  Diagnosis Date  . Pilonidal cyst     MEDICATIONS:  Prior to Admission medications   Medication Sig Start Date End Date Taking? Authorizing Provider  cephALEXin (KEFLEX) 500 MG capsule Take 500 mg by mouth 4 (four) times daily.    [provider]  glucose blood (FREESTYLE LITE) test strip Check BS bid 01/05/16   Susy Frizzle, MD  glucose monitoring kit (FREESTYLE) monitoring kit 1 each by Does not apply route as needed for other. 01/05/16   Susy Frizzle, MD  Lancets (FREESTYLE) lancets Check BS bid 01/05/16   Susy Frizzle, MD  traMADol (ULTRAM) 50 MG tablet Take 1 tablet (50 mg total) by mouth every 6 (six) hours as needed. 04/05/17   Alycia Rossetti, MD    ALLERGIES:  Allergies  Allergen Reactions  . Erythromycin     SOCIAL HISTORY:  Social History   Tobacco Use  . Smoking status: Former Research scientist (life sciences)  . Smokeless tobacco: Former Network engineer Use Topics  . Alcohol use: Yes    Alcohol/week: 0.0 oz    Types: 6 - 12 Cans of beer per week    FAMILY HISTORY: Family History  Problem Relation Age of Onset  . Cancer Mother        breast  . Diabetes Mother   . Cancer Father        skin    EXAM: BP (!) 136/92   Pulse (!) 110   Temp 99.4 F (37.4  C) (Oral)   Resp 20   Ht '6\' 4"'$  (1.93 m)   Wt 131.5 kg (290 lb)   SpO2 98%   BMI 35.30 kg/m  CONSTITUTIONAL: Alert and oriented and responds appropriately to questions. Well-appearing; well-nourished HEAD: Normocephalic EYES: Conjunctivae clear, pupils appear equal, EOMI ENT: normal nose; moist mucous membranes NECK: Supple, no meningismus, no nuchal rigidity, no LAD  CARD: RRR; S1 and S2 appreciated; no murmurs, no clicks, no rubs, no gallops RESP: Normal chest excursion without splinting or tachypnea; breath sounds clear and equal bilaterally; no wheezes, no rhonchi, no rales, no hypoxia or respiratory distress, speaking full sentences ABD/GI: Normal bowel sounds; non-distended; soft, non-tender, no rebound, no guarding, no peritoneal signs, no hepatosplenomegaly BACK:  The back appears normal and is non-tender to palpation, there is no CVA tenderness EXT: Tender over the fifth proximal right phalanx.  Swelling and ecchymosis to this area.  No tenderness of the right ankle or right proximal uvula.  Decreased range of motion of the fourth and fifth toes of the right foot secondary to pain but otherwise normal range of motion and no ligamentous laxity.  2+ DP pulses bilaterally.  Otherwise normal ROM in all joints; otherwise extremities are non-tender to palpation;  no edema; normal capillary refill; no cyanosis, no calf tenderness or swelling    SKIN: Normal color for age and race; warm; no rash NEURO: Moves all extremities equally PSYCH: The patient's mood and manner are appropriate. Grooming and personal hygiene are appropriate.  MEDICAL DECISION MAKING: Patient here with a Jones fracture to the right foot.  Appears nondisplaced.  Neurovascular intact distally.  Will place him in a splint and have him follow-up closely with orthopedics but discussed with him that this would likely be nonoperative.  He will need to be nonweightbearing for 6 to 8 weeks.  Provided him with crutches and work note.   Discharge with pain medication.  No other injury on examination.  SPLINT APPLICATION Date: 10/26/50 Authorized by: Cyril Mourning Nelson Noone Consent: Verbal consent obtained. Risks and benefits: risks, benefits and alternatives were discussed Consent given by: patient Splint applied by: technician Location details: right foot Splint type: posterior short leg Supplies used: fiberglass Post-procedure: The splinted body part was neurovascularly unchanged following the procedure. Patient tolerance: Patient tolerated the procedure well with no immediate complications.   At this time, I do not feel there is any life-threatening condition present. I have reviewed and discussed all results (EKG, imaging, lab, urine as appropriate) and exam findings with patient/family. I have reviewed nursing notes and appropriate previous records.  I feel the patient is safe to be discharged home without further emergent workup and can continue workup as an outpatient as needed. Discussed usual and customary return precautions. Patient/family verbalize understanding and are comfortable with this plan.  Outpatient follow-up has been provided if needed. All questions have been answered.      Azora Bonzo, Delice Bison, DO 12/29/17 [phone]

## 2017-12-29 MED ORDER — DOCUSATE SODIUM 100 MG PO CAPS: 100.0000 mg | ORAL_CAPSULE | Freq: Two times a day (BID) | ORAL | 0 refills | Status: DC

## 2017-12-29 MED ORDER — HYDROCODONE-ACETAMINOPHEN 5-325 MG PO TABS: 2.0000 | ORAL_TABLET | Freq: Four times a day (QID) | ORAL | 0 refills | Status: DC | PRN

## 2018-01-02 ENCOUNTER — Ambulatory Visit (INDEPENDENT_AMBULATORY_CARE_PROVIDER_SITE_OTHER): Payer: Worker's Compensation | Admitting: Orthopaedic Surgery

## 2018-01-02 ENCOUNTER — Encounter (INDEPENDENT_AMBULATORY_CARE_PROVIDER_SITE_OTHER): Payer: Self-pay | Admitting: Orthopaedic Surgery

## 2018-01-02 DIAGNOSIS — S92354A Nondisplaced fracture of fifth metatarsal bone, right foot, initial encounter for closed fracture: Secondary | ICD-10-CM

## 2018-01-02 MED ORDER — CALCIUM CARBONATE-VITAMIN D 500-200 MG-UNIT PO TABS: 1.0000 | ORAL_TABLET | Freq: Three times a day (TID) | ORAL | 12 refills | Status: DC

## 2018-01-02 MED ORDER — ZINC SULFATE 220 (50 ZN) MG PO CAPS: 220.0000 mg | ORAL_CAPSULE | Freq: Every day | ORAL | 0 refills | Status: DC

## 2018-01-02 MED ORDER — TRAMADOL HCL 50 MG PO TABS: 50.0000 mg | ORAL_TABLET | Freq: Every evening | ORAL | 2 refills | Status: DC | PRN

## 2018-01-02 NOTE — Progress Notes (Signed)
Office Visit Note   Patient: Samuel Sellers           Date of Birth: April 23, 1976           MRN: [id number] Visit Date: 01/02/2018              Requested by: Dorena Bodo, PA-C [address], [place] [address] PCP: Dorena Bodo, PA-C   Assessment & Plan: Visit Diagnoses:  1. Nondisplaced fracture of fifth metatarsal bone, right foot, initial encounter for closed fracture     Plan: Impression is right fifth metatarsal fracture nondisplaced.  We will treat this nonoperatively.  Weight-bear as tolerated in a Cam walker with crutches as needed.  Prescription for knee scooter.  Desk duty to begin in a week is appropriate.  Follow-up in 2 weeks with three-view x-rays of the right foot.  Prescription for tramadol, calcium and vitamin D examined.  Follow-Up Instructions: Return in about 2 weeks (around 01/16/2018).   Orders:  No orders of the defined types were placed in this encounter.  Meds ordered this encounter  Medications  . traMADol (ULTRAM) 50 MG tablet    Sig: Take 1-2 tablets (50-100 mg total) by mouth at bedtime as needed.    Dispense:  30 tablet    Refill:  2  . calcium-vitamin D (OSCAL WITH D) 500-200 MG-UNIT tablet    Sig: Take 1 tablet by mouth 3 (three) times daily.    Dispense:  90 tablet    Refill:  12  . zinc sulfate 220 (50 Zn) MG capsule    Sig: Take 1 capsule (220 mg total) by mouth daily.    Dispense:  42 capsule    Refill:  0      Procedures: No procedures performed   Clinical Data: No additional findings.   Subjective: Chief Complaint  Patient presents with  . Right Foot - Pain    Samuel Sellers is a 42 year old gentleman comes in for invasive his right fifth metatarsal fracture from inversion injury last week.  He works as a IT sales professional.  He denies any numbness and tingling.  He is endorsing pain with weightbearing and with ambulation.   Review of Systems  Constitutional: Negative.   All other systems reviewed and are  negative.    Objective: Vital Signs: There were no vitals taken for this visit.  Physical Exam  Constitutional: He is oriented to person, place, and time. He appears well-developed and well-nourished.  HENT:  Head: Normocephalic and atraumatic.  Eyes: Pupils are equal, round, and reactive to light.  Neck: Neck supple.  Pulmonary/Chest: Effort normal.  Abdominal: Soft.  Musculoskeletal: Normal range of motion.  Neurological: He is alert and oriented to person, place, and time.  Skin: Skin is warm.  Psychiatric: He has a normal mood and affect. His behavior is normal. Judgment and thought content normal.  Nursing note and vitals reviewed.   Ortho Exam Right foot exam shows moderate swelling.  No neurovascular compromise.  Tenderness palpation of the base of fifth metatarsal. Specialty Comments:  No specialty comments available.  Imaging: No results found.   PMFS History: Patient Active Problem List   Diagnosis Date Noted  . Injury of left foot 06/24/2015  . Pilonidal cyst 02/17/2011   Past Medical History:  Diagnosis Date  . Pilonidal cyst     Family History  Problem Relation Age of Onset  . Cancer Mother        breast  . Diabetes Mother   .  Cancer Father        skin    Past Surgical History:  Procedure Laterality Date  . PILONIDAL CYST EXCISION  03/15/11  . TONSILLECTOMY     Social History   Occupational History  . Not on file  Tobacco Use  . Smoking status: Former Games developer  . Smokeless tobacco: Former Engineer, water and Sexual Activity  . Alcohol use: Yes    Alcohol/week: 0.0 oz    Types: 6 - 12 Cans of beer per week  . Drug use: No  . Sexual activity: Not on file

## 2018-01-17 ENCOUNTER — Ambulatory Visit (INDEPENDENT_AMBULATORY_CARE_PROVIDER_SITE_OTHER): Payer: Worker's Compensation | Admitting: Orthopaedic Surgery

## 2018-01-17 ENCOUNTER — Ambulatory Visit (INDEPENDENT_AMBULATORY_CARE_PROVIDER_SITE_OTHER): Payer: Worker's Compensation

## 2018-01-17 ENCOUNTER — Encounter (INDEPENDENT_AMBULATORY_CARE_PROVIDER_SITE_OTHER): Payer: Self-pay | Admitting: Orthopaedic Surgery

## 2018-01-17 VITALS — Ht 76.0 in | Wt 290.0 lb

## 2018-01-17 DIAGNOSIS — S92351K Displaced fracture of fifth metatarsal bone, right foot, subsequent encounter for fracture with nonunion: Secondary | ICD-10-CM | POA: Insufficient documentation

## 2018-01-17 DIAGNOSIS — S92354A Nondisplaced fracture of fifth metatarsal bone, right foot, initial encounter for closed fracture: Secondary | ICD-10-CM

## 2018-01-17 NOTE — Progress Notes (Signed)
     Patient: Samuel Sellers           Date of Birth: [birth year]           MRN: [id number] Visit Date: 01/17/2018 PCP: Dorena Bodo, PA-C   Assessment & Plan:  Chief Complaint:  Chief Complaint  Patient presents with  . Right Foot - Follow-up    Right foot nondisplaced fx 5th MT   Visit Diagnoses:  1. Nondisplaced fracture of fifth metatarsal bone, right foot, initial encounter for closed fracture     Plan: Patient is a pleasant 42 year old gentleman who presents to our clinic today nearly 3 weeks status post right foot nondisplaced fifth metatarsal fracture.  He has been fairly compliant weightbearing as tolerated in the cam walker although he does admit to weightbearing at times without the boot.  He still admits to moderate pain.  He has been on calcium and zinc over the past few weeks.  Examination of the right foot reveals no swelling.  Moderate tenderness to the proximal fifth metatarsal.  He is neurovascularly intact distally.  Of note, this patient has lost 30 pounds and is no longer diabetic.  At this point, we will have the patient continue wearing his cam walker weightbearing as tolerated.  Follow-up with [place] in 3 weeks time for repeat evaluation and x-ray.  Call with concerns or questions in the meantime.  Follow-Up Instructions: Return in about 3 weeks (around 02/07/2018).   Orders:  Orders Placed This Encounter  Procedures  . XR Foot Complete Right   No orders of the defined types were placed in this encounter.   Imaging: Xr Foot Complete Right  Result Date: 01/17/2018 Stable alignment of the fracture   PMFS History: Patient Active Problem List   Diagnosis Date Noted  . Nondisplaced fracture of fifth metatarsal bone, right foot, initial encounter for closed fracture 01/17/2018  . Injury of left foot 06/24/2015  . Pilonidal cyst 02/17/2011   Past Medical History:  Diagnosis Date  . Pilonidal cyst     Family History  Problem Relation Age of Onset  .  Cancer Mother        breast  . Diabetes Mother   . Cancer Father        skin    Past Surgical History:  Procedure Laterality Date  . PILONIDAL CYST EXCISION  03/15/11  . TONSILLECTOMY     Social History   Occupational History  . Not on file  Tobacco Use  . Smoking status: Former Games developer  . Smokeless tobacco: Former Engineer, water and Sexual Activity  . Alcohol use: Yes    Alcohol/week: 0.0 oz    Types: 6 - 12 Cans of beer per week  . Drug use: No  . Sexual activity: Not on file

## 2018-01-23 ENCOUNTER — Telehealth (INDEPENDENT_AMBULATORY_CARE_PROVIDER_SITE_OTHER): Payer: Self-pay | Admitting: Orthopaedic Surgery

## 2018-01-23 NOTE — Telephone Encounter (Signed)
I received the voicemail you transferred me about patients paper work being faxed over. I have not received any paperwork, not sure if Samuel Sellers has anything.   Samuel Sellers Samuel Sellers's message below. Thank you.   Samuel Sellers- Do you know anything about this?

## 2018-01-23 NOTE — Telephone Encounter (Signed)
Patient called checking on forms. Nothing is scanned in chart and Tirsa w/ [employer] is out of the office thru Wed. I advised patient of this and gave him her ph# to call first thing Thursday morning.

## 2018-01-26 NOTE — Telephone Encounter (Signed)
I spoke to him. He faxed them on Friday and again straight to me this morning. He'll come to the office and pay the fee.

## 2018-02-07 ENCOUNTER — Ambulatory Visit (INDEPENDENT_AMBULATORY_CARE_PROVIDER_SITE_OTHER): Payer: Worker's Compensation

## 2018-02-07 ENCOUNTER — Ambulatory Visit (INDEPENDENT_AMBULATORY_CARE_PROVIDER_SITE_OTHER): Payer: Worker's Compensation | Admitting: Orthopaedic Surgery

## 2018-02-07 DIAGNOSIS — S92354A Nondisplaced fracture of fifth metatarsal bone, right foot, initial encounter for closed fracture: Secondary | ICD-10-CM

## 2018-02-07 DIAGNOSIS — M79671 Pain in right foot: Secondary | ICD-10-CM

## 2018-02-07 NOTE — Progress Notes (Signed)
   Office Visit Note   Patient: Samuel Sellers           Date of Birth: [birth year]           MRN: [id number] Visit Date: 02/07/2018              Requested by: Dorena Bodoixon, Mary B, PA-C [address] [place], [place] [id number] PCP: Dorena Bodoixon, Mary B, PA-C   Assessment & Plan: Visit Diagnoses:  1. Nondisplaced fracture of fifth metatarsal bone, right foot, initial encounter for closed fracture   2. Pain in right foot     Plan: Overall his x-rays are demonstrating healing although he still has a persistent lucency.  Today we discussed operative treatment with a intramedullary screw to speed up recovery versus watching this for another 4 weeks to see if he will improve from this.  I think it is prudent to give another 4 weeks to see if he healed his fracture significantly or not.  We will see him back at that time with three-view x-rays of the right foot.  Work note was provided today.  Follow-Up Instructions: Return in about 1 month (around 03/07/2018).   Orders:  Orders Placed This Encounter  Procedures  . XR Foot Complete Right   No orders of the defined types were placed in this encounter.     Procedures: No procedures performed   Clinical Data: No additional findings.   Subjective: Chief Complaint  Patient presents with  . Right Foot - Follow-up    Samuel Sellers follows up for his fifth metatarsal fracture.  His pain is moderate.  Is worse with weightbearing.  He has been tolerating the boot fine.  He is been out of work.   Review of Systems   Objective: Vital Signs: There were no vitals taken for this visit.  Physical Exam  Ortho Exam Right foot exam shows moderate tenderness palpation at the base of fifth metatarsal.  No significant swelling. Specialty Comments:  No specialty comments available.  Imaging: Xr Foot Complete Right  Result Date: 02/07/2018 Persistent lucency through the fracture site on the lateral half.  There is improvement of the fracture line  overall compared to the most recent x-ray.  There is bridging bony consolidation on the medial half of the fracture.    PMFS History: Patient Active Problem List   Diagnosis Date Noted  . Nondisplaced fracture of fifth metatarsal bone, right foot, initial encounter for closed fracture 01/17/2018  . Injury of left foot 06/24/2015  . Pilonidal cyst 02/17/2011   Past Medical History:  Diagnosis Date  . Pilonidal cyst     Family History  Problem Relation Age of Onset  . Cancer Mother        breast  . Diabetes Mother   . Cancer Father        skin    Past Surgical History:  Procedure Laterality Date  . PILONIDAL CYST EXCISION  03/15/11  . TONSILLECTOMY     Social History   Occupational History  . Not on file  Tobacco Use  . Smoking status: Former Games developermoker  . Smokeless tobacco: Former Engineer, waterUser  Substance and Sexual Activity  . Alcohol use: Yes    Alcohol/week: 3.6 - 7.2 oz    Types: 6 - 12 Cans of beer per week  . Drug use: No  . Sexual activity: Not on file

## 2018-03-08 ENCOUNTER — Telehealth (INDEPENDENT_AMBULATORY_CARE_PROVIDER_SITE_OTHER): Payer: Self-pay

## 2018-03-08 NOTE — Telephone Encounter (Signed)
Faxed the June office note and work note to [facility] adj per her request. Need to fax tomorrows notes to her as well.

## 2018-03-09 ENCOUNTER — Other Ambulatory Visit (INDEPENDENT_AMBULATORY_CARE_PROVIDER_SITE_OTHER): Payer: Self-pay

## 2018-03-09 ENCOUNTER — Encounter (INDEPENDENT_AMBULATORY_CARE_PROVIDER_SITE_OTHER): Payer: Self-pay | Admitting: Orthopaedic Surgery

## 2018-03-09 ENCOUNTER — Ambulatory Visit (INDEPENDENT_AMBULATORY_CARE_PROVIDER_SITE_OTHER): Payer: Worker's Compensation

## 2018-03-09 ENCOUNTER — Ambulatory Visit (INDEPENDENT_AMBULATORY_CARE_PROVIDER_SITE_OTHER): Payer: Worker's Compensation | Admitting: Orthopaedic Surgery

## 2018-03-09 VITALS — Ht 76.0 in | Wt 290.0 lb

## 2018-03-09 DIAGNOSIS — S92354A Nondisplaced fracture of fifth metatarsal bone, right foot, initial encounter for closed fracture: Secondary | ICD-10-CM

## 2018-03-09 NOTE — Progress Notes (Signed)
   Office Visit Note   Patient: Samuel Sellers           Date of Birth: [birth year]           MRN: [id number] Visit Date: 03/09/2018              Requested by: Dorena Bodoixon, Mary B, PA-C [address] [place], [place] [id number] PCP: Dorena Bodoixon, Mary B, PA-C   Assessment & Plan: Visit Diagnoses:  1. Nondisplaced fracture of fifth metatarsal bone, right foot, initial encounter for closed fracture     Plan: impression is 42 year old gentleman with nonunion of right Jones fracture.  These findings were discussed with the patient and at this point patient continues to be symptomatic despite conservative treatment.  Recommendation is for screw fixation and bone grafting at this point.  Rehab and recovery was discussed.  Anticipate patient out of work for 2 to 3 months after surgery.  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  Follow-Up Instructions: Return if symptoms worsen or fail to improve.   Orders:  Orders Placed This Encounter  Procedures  . XR Foot Complete Right   No orders of the defined types were placed in this encounter.     Procedures: No procedures performed   Clinical Data: No additional findings.   Subjective: Chief Complaint  Patient presents with  . Right Foot - Follow-up    Nondisplaced fx 5th MT right foot.     Samuel Sellers follows up today for his fifth metatarsal fracture.  He is still ambulating with a Cam walker.  He continues to complain of soreness and tenderness mostly on the base of his foot.   Review of Systems   Objective: Vital Signs: Ht 6\' 4"  (1.93 m)   Wt 290 lb (131.5 kg)   BMI 35.30 kg/m   Physical Exam  Ortho Exam Right foot exam shows significant tenderness of the base of fifth metatarsal more on the plantar side.  Swelling. Specialty Comments:  No specialty comments available.  Imaging: Xr Foot Complete Right  Result Date: 03/09/2018 Persistent lucency of fifth metatarsal fracture consistent with nonunion.    PMFS  History: Patient Active Problem List   Diagnosis Date Noted  . Nondisplaced fracture of fifth metatarsal bone, right foot, initial encounter for closed fracture 01/17/2018  . Injury of left foot 06/24/2015  . Pilonidal cyst 02/17/2011   Past Medical History:  Diagnosis Date  . Pilonidal cyst     Family History  Problem Relation Age of Onset  . Cancer Mother        breast  . Diabetes Mother   . Cancer Father        skin    Past Surgical History:  Procedure Laterality Date  . PILONIDAL CYST EXCISION  03/15/11  . TONSILLECTOMY     Social History   Occupational History  . Not on file  Tobacco Use  . Smoking status: Former Games developermoker  . Smokeless tobacco: Former Engineer, waterUser  Substance and Sexual Activity  . Alcohol use: Yes    Alcohol/week: 3.6 - 7.2 oz    Types: 6 - 12 Cans of beer per week  . Drug use: No  . Sexual activity: Not on file

## 2018-03-09 NOTE — Telephone Encounter (Signed)
Faxed todays office note and surgery request to wc adj June LeapJoann Jones Fax#[phone]

## 2018-04-01 DIAGNOSIS — H16142 Punctate keratitis, left eye: Secondary | ICD-10-CM | POA: Diagnosis not present

## 2018-04-05 ENCOUNTER — Encounter (HOSPITAL_BASED_OUTPATIENT_CLINIC_OR_DEPARTMENT_OTHER): Payer: Self-pay | Admitting: *Deleted

## 2018-04-05 ENCOUNTER — Other Ambulatory Visit: Payer: Self-pay

## 2018-04-12 ENCOUNTER — Encounter (HOSPITAL_BASED_OUTPATIENT_CLINIC_OR_DEPARTMENT_OTHER)
Admission: RE | Disposition: A | Discharge status: Home / Self Care | Payer: Self-pay | Source: Ambulatory Visit | Attending: Orthopaedic Surgery

## 2018-04-12 ENCOUNTER — Ambulatory Visit (HOSPITAL_BASED_OUTPATIENT_CLINIC_OR_DEPARTMENT_OTHER): Payer: Worker's Compensation | Admitting: Anesthesiology

## 2018-04-12 ENCOUNTER — Encounter (HOSPITAL_BASED_OUTPATIENT_CLINIC_OR_DEPARTMENT_OTHER): Payer: Self-pay | Admitting: *Deleted

## 2018-04-12 ENCOUNTER — Other Ambulatory Visit: Payer: Self-pay

## 2018-04-12 ENCOUNTER — Ambulatory Visit (HOSPITAL_BASED_OUTPATIENT_CLINIC_OR_DEPARTMENT_OTHER)
Admission: RE | Admit: 2018-04-12 | Discharge: 2018-04-12 | Disposition: A | Discharge status: Home / Self Care | Payer: Worker's Compensation | Source: Ambulatory Visit | Attending: Orthopaedic Surgery | Admitting: Orthopaedic Surgery

## 2018-04-12 DIAGNOSIS — S92351K Displaced fracture of fifth metatarsal bone, right foot, subsequent encounter for fracture with nonunion: Secondary | ICD-10-CM | POA: Diagnosis present

## 2018-04-12 DIAGNOSIS — Z6835 Body mass index (BMI) 35.0-35.9, adult: Secondary | ICD-10-CM | POA: Insufficient documentation

## 2018-04-12 DIAGNOSIS — X58XXXD Exposure to other specified factors, subsequent encounter: Secondary | ICD-10-CM | POA: Diagnosis not present

## 2018-04-12 DIAGNOSIS — Z87891 Personal history of nicotine dependence: Secondary | ICD-10-CM | POA: Diagnosis not present

## 2018-04-12 DIAGNOSIS — Z881 Allergy status to other antibiotic agents status: Secondary | ICD-10-CM | POA: Insufficient documentation

## 2018-04-12 DIAGNOSIS — E669 Obesity, unspecified: Secondary | ICD-10-CM | POA: Insufficient documentation

## 2018-04-12 DIAGNOSIS — R7303 Prediabetes: Secondary | ICD-10-CM | POA: Insufficient documentation

## 2018-04-12 DIAGNOSIS — Z79899 Other long term (current) drug therapy: Secondary | ICD-10-CM | POA: Diagnosis not present

## 2018-04-12 HISTORY — DX: Fracture of unspecified metatarsal bone(s), unspecified foot, initial encounter for closed fracture: S92.309A

## 2018-04-12 HISTORY — DX: Prediabetes: R73.03

## 2018-04-12 HISTORY — PX: ORIF TOE FRACTURE: SHX5032

## 2018-04-12 HISTORY — DX: Snoring: R06.83

## 2018-04-12 SURGERY — OPEN REDUCTION INTERNAL FIXATION (ORIF) METATARSAL (TOE) FRACTURE
Anesthesia: General | Site: Foot | Laterality: Right

## 2018-04-12 MED ORDER — FENTANYL CITRATE (PF) 100 MCG/2ML IJ SOLN
INTRAMUSCULAR | Status: AC
  Filled 2018-04-12: qty 2

## 2018-04-12 MED ORDER — ONDANSETRON HCL 4 MG/2ML IJ SOLN
INTRAMUSCULAR | Status: DC | PRN
  Administered 2018-04-12: 4 mg via INTRAVENOUS

## 2018-04-12 MED ORDER — DEXTROSE 5 % IV SOLN
3.0000 g | Freq: Once | INTRAVENOUS | Status: AC
  Administered 2018-04-12: 3 g via INTRAVENOUS

## 2018-04-12 MED ORDER — BUPIVACAINE-EPINEPHRINE (PF) 0.5% -1:200000 IJ SOLN
INTRAMUSCULAR | Status: DC | PRN
  Administered 2018-04-12: 30 mL via PERINEURAL

## 2018-04-12 MED ORDER — FENTANYL CITRATE (PF) 100 MCG/2ML IJ SOLN
INTRAMUSCULAR | Status: DC | PRN
  Administered 2018-04-12 (×2): 25 ug via INTRAVENOUS

## 2018-04-12 MED ORDER — CHLORHEXIDINE GLUCONATE 4 % EX LIQD: 60.0000 mL | Freq: Once | CUTANEOUS | Status: DC

## 2018-04-12 MED ORDER — BUPIVACAINE HCL 0.25 % IJ SOLN
INTRAMUSCULAR | Status: DC | PRN
  Administered 2018-04-12: 12 mL

## 2018-04-12 MED ORDER — SCOPOLAMINE 1 MG/3DAYS TD PT72: 1.0000 | MEDICATED_PATCH | Freq: Once | TRANSDERMAL | Status: DC | PRN

## 2018-04-12 MED ORDER — ZINC SULFATE 220 (50 ZN) MG PO CAPS: 220.0000 mg | ORAL_CAPSULE | Freq: Every day | ORAL | 0 refills | Status: DC

## 2018-04-12 MED ORDER — LIDOCAINE 2% (20 MG/ML) 5 ML SYRINGE
INTRAMUSCULAR | Status: AC
  Filled 2018-04-12: qty 5

## 2018-04-12 MED ORDER — LIDOCAINE HCL (CARDIAC) PF 100 MG/5ML IV SOSY
PREFILLED_SYRINGE | INTRAVENOUS | Status: DC | PRN
  Administered 2018-04-12: 100 mg via INTRAVENOUS

## 2018-04-12 MED ORDER — ONDANSETRON HCL 4 MG/2ML IJ SOLN
INTRAMUSCULAR | Status: AC
  Filled 2018-04-12: qty 4

## 2018-04-12 MED ORDER — CEFAZOLIN SODIUM-DEXTROSE 2-4 GM/100ML-% IV SOLN: 2.0000 g | INTRAVENOUS | Status: DC

## 2018-04-12 MED ORDER — CALCIUM CARBONATE-VITAMIN D 500-200 MG-UNIT PO TABS: 1.0000 | ORAL_TABLET | Freq: Three times a day (TID) | ORAL | 12 refills | Status: DC

## 2018-04-12 MED ORDER — LACTATED RINGERS IV SOLN
INTRAVENOUS | Status: DC
  Administered 2018-04-12: 09:00:00 via INTRAVENOUS

## 2018-04-12 MED ORDER — ASPIRIN EC 81 MG PO TBEC: 81.0000 mg | DELAYED_RELEASE_TABLET | Freq: Two times a day (BID) | ORAL | 0 refills | Status: DC

## 2018-04-12 MED ORDER — OXYCODONE-ACETAMINOPHEN 5-325 MG PO TABS: 1.0000 | ORAL_TABLET | Freq: Three times a day (TID) | ORAL | 0 refills | Status: AC | PRN

## 2018-04-12 MED ORDER — PROPOFOL 10 MG/ML IV BOLUS
INTRAVENOUS | Status: DC | PRN
  Administered 2018-04-12: 200 mg via INTRAVENOUS

## 2018-04-12 MED ORDER — PROMETHAZINE HCL 25 MG PO TABS: 25.0000 mg | ORAL_TABLET | Freq: Four times a day (QID) | ORAL | 1 refills | Status: DC | PRN

## 2018-04-12 MED ORDER — CEFAZOLIN SODIUM-DEXTROSE 2-4 GM/100ML-% IV SOLN
INTRAVENOUS | Status: AC
  Filled 2018-04-12: qty 200

## 2018-04-12 MED ORDER — PROMETHAZINE HCL 25 MG/ML IJ SOLN: 6.2500 mg | INTRAMUSCULAR | Status: DC | PRN

## 2018-04-12 MED ORDER — MIDAZOLAM HCL 2 MG/2ML IJ SOLN
INTRAMUSCULAR | Status: AC
  Filled 2018-04-12: qty 2

## 2018-04-12 MED ORDER — FENTANYL CITRATE (PF) 100 MCG/2ML IJ SOLN
50.0000 ug | INTRAMUSCULAR | Status: DC | PRN
  Administered 2018-04-12: 100 ug via INTRAVENOUS

## 2018-04-12 MED ORDER — FENTANYL CITRATE (PF) 100 MCG/2ML IJ SOLN: 25.0000 ug | INTRAMUSCULAR | Status: DC | PRN

## 2018-04-12 MED ORDER — MIDAZOLAM HCL 2 MG/2ML IJ SOLN
1.0000 mg | INTRAMUSCULAR | Status: DC | PRN
  Administered 2018-04-12: 2 mg via INTRAVENOUS

## 2018-04-12 MED ORDER — ONDANSETRON HCL 4 MG PO TABS: 4.0000 mg | ORAL_TABLET | Freq: Three times a day (TID) | ORAL | 0 refills | Status: DC | PRN

## 2018-04-12 MED ORDER — DEXAMETHASONE SODIUM PHOSPHATE 4 MG/ML IJ SOLN
INTRAMUSCULAR | Status: DC | PRN
  Administered 2018-04-12: 10 mg via INTRAVENOUS

## 2018-04-12 MED ORDER — LACTATED RINGERS IV SOLN: INTRAVENOUS | Status: DC

## 2018-04-12 MED ORDER — DEXAMETHASONE SODIUM PHOSPHATE 10 MG/ML IJ SOLN
INTRAMUSCULAR | Status: AC
  Filled 2018-04-12: qty 1

## 2018-04-12 SURGICAL SUPPLY — 77 items
BANDAGE ACE 4X5 VEL STRL LF (GAUZE/BANDAGES/DRESSINGS) IMPLANT
BANDAGE ESMARK 6X9 LF (GAUZE/BANDAGES/DRESSINGS) ×1 IMPLANT
BIT DRILL CAN 3.5MM (BIT) ×1
BIT DRILL CANN 3.5 (BIT) ×2
BIT DRILL SRG 3.5XCAN FFTH MTR (BIT) IMPLANT
BIT DRL SRG 3.5XCANN FIFTH MTR (BIT) ×1
BLADE HEX COATED 2.75 (ELECTRODE) ×3 IMPLANT
BLADE SURG 15 STRL LF DISP TIS (BLADE) ×2 IMPLANT
BLADE SURG 15 STRL SS (BLADE) ×6
BNDG CMPR 9X6 STRL LF SNTH (GAUZE/BANDAGES/DRESSINGS) ×1
BNDG COHESIVE 3X5 TAN STRL LF (GAUZE/BANDAGES/DRESSINGS) ×2 IMPLANT
BNDG ESMARK 6X9 LF (GAUZE/BANDAGES/DRESSINGS) ×3
BOOT STEPPER DURA LG (SOFTGOODS) ×2 IMPLANT
CANISTER SUCT 1200ML W/VALVE (MISCELLANEOUS) ×3 IMPLANT
COVER BACK TABLE 60X90IN (DRAPES) ×3 IMPLANT
CUFF TOURNIQUET SINGLE 34IN LL (TOURNIQUET CUFF) IMPLANT
DECANTER SPIKE VIAL GLASS SM (MISCELLANEOUS) IMPLANT
DRAPE C-ARM 42X72 X-RAY (DRAPES) ×1 IMPLANT
DRAPE C-ARMOR (DRAPES) ×1 IMPLANT
DRAPE EXTREMITY T 121X128X90 (DRAPE) ×3 IMPLANT
DRAPE IMP U-DRAPE 54X76 (DRAPES) ×3 IMPLANT
DRAPE OEC MINIVIEW 54X84 (DRAPES) ×3 IMPLANT
DRAPE SURG 17X23 STRL (DRAPES) ×2 IMPLANT
DRAPE U-SHAPE 47X51 STRL (DRAPES) ×2 IMPLANT
DRSG PAD ABDOMINAL 8X10 ST (GAUZE/BANDAGES/DRESSINGS) ×6 IMPLANT
DURAPREP 26ML APPLICATOR (WOUND CARE) ×5 IMPLANT
ELECT REM PT RETURN 9FT ADLT (ELECTROSURGICAL) ×3
ELECTRODE REM PT RTRN 9FT ADLT (ELECTROSURGICAL) ×1 IMPLANT
GAUZE SPONGE 4X4 12PLY STRL (GAUZE/BANDAGES/DRESSINGS) ×3 IMPLANT
GAUZE XEROFORM 1X8 LF (GAUZE/BANDAGES/DRESSINGS) ×3 IMPLANT
GLOVE BIOGEL PI IND STRL 7.0 (GLOVE) ×1 IMPLANT
GLOVE BIOGEL PI INDICATOR 7.0 (GLOVE) ×4
GLOVE ECLIPSE 6.5 STRL STRAW (GLOVE) ×2 IMPLANT
GLOVE ECLIPSE 7.0 STRL STRAW (GLOVE) ×3 IMPLANT
GLOVE SKINSENSE NS SZ7.5 (GLOVE) ×2
GLOVE SKINSENSE STRL SZ7.5 (GLOVE) ×1 IMPLANT
GLOVE SURG SYN 7.5  E (GLOVE) ×2
GLOVE SURG SYN 7.5 E (GLOVE) ×1 IMPLANT
GLOVE SURG SYN 7.5 PF PI (GLOVE) ×1 IMPLANT
GOWN STRL REIN XL XLG (GOWN DISPOSABLE) ×3 IMPLANT
GOWN STRL REUS W/ TWL LRG LVL3 (GOWN DISPOSABLE) ×1 IMPLANT
GOWN STRL REUS W/ TWL XL LVL3 (GOWN DISPOSABLE) ×1 IMPLANT
GOWN STRL REUS W/TWL LRG LVL3 (GOWN DISPOSABLE) ×3
GOWN STRL REUS W/TWL XL LVL3 (GOWN DISPOSABLE) ×3
GUIDEWIRE .07X8 (WIRE) ×2 IMPLANT
NEEDLE HYPO 22GX1.5 SAFETY (NEEDLE) IMPLANT
NS IRRIG 1000ML POUR BTL (IV SOLUTION) ×3 IMPLANT
PACK BASIN DAY SURGERY FS (CUSTOM PROCEDURE TRAY) ×3 IMPLANT
PAD CAST 3X4 CTTN HI CHSV (CAST SUPPLIES) IMPLANT
PAD CAST 4YDX4 CTTN HI CHSV (CAST SUPPLIES) IMPLANT
PADDING CAST COTTON 3X4 STRL (CAST SUPPLIES)
PADDING CAST COTTON 4X4 STRL (CAST SUPPLIES) ×6
PADDING CAST SYN 6 (CAST SUPPLIES)
PADDING CAST SYNTHETIC 4 (CAST SUPPLIES)
PADDING CAST SYNTHETIC 4X4 STR (CAST SUPPLIES) IMPLANT
PADDING CAST SYNTHETIC 6X4 NS (CAST SUPPLIES) IMPLANT
PENCIL BUTTON HOLSTER BLD 10FT (ELECTRODE) ×3 IMPLANT
SCREW 5.5X50MM (Screw) ×2 IMPLANT
SHEET MEDIUM DRAPE 40X70 STRL (DRAPES) ×3 IMPLANT
SLEEVE SCD COMPRESS KNEE MED (MISCELLANEOUS) ×3 IMPLANT
SPLINT FIBERGLASS 3X35 (CAST SUPPLIES) IMPLANT
SPLINT FIBERGLASS 4X30 (CAST SUPPLIES) IMPLANT
SPONGE LAP 18X18 RF (DISPOSABLE) ×3 IMPLANT
SUCTION FRAZIER HANDLE 10FR (MISCELLANEOUS) ×2
SUCTION TUBE FRAZIER 10FR DISP (MISCELLANEOUS) ×1 IMPLANT
SUT ETHILON 3 0 PS 1 (SUTURE) IMPLANT
SUT VIC AB 0 CT1 27 (SUTURE)
SUT VIC AB 0 CT1 27XBRD ANBCTR (SUTURE) IMPLANT
SUT VIC AB 2-0 CT1 27 (SUTURE) ×3
SUT VIC AB 2-0 CT1 TAPERPNT 27 (SUTURE) ×1 IMPLANT
SYR BULB 3OZ (MISCELLANEOUS) ×3 IMPLANT
SYR CONTROL 10ML LL (SYRINGE) IMPLANT
TOWEL GREEN STERILE FF (TOWEL DISPOSABLE) ×3 IMPLANT
TUBE CONNECTING 20'X1/4 (TUBING) ×1
TUBE CONNECTING 20X1/4 (TUBING) ×2 IMPLANT
UNDERPAD 30X30 (UNDERPADS AND DIAPERS) ×3 IMPLANT
YANKAUER SUCT BULB TIP NO VENT (SUCTIONS) ×3 IMPLANT

## 2018-04-12 NOTE — Op Note (Signed)
   Date of Surgery: 04/12/2018  INDICATIONS: Mr. Samuel Sellers is a 42 y.o.-year-old male with a right jones fracture nonunion;  The patient did consent to the procedure after discussion of the risks and benefits.  PREOPERATIVE DIAGNOSIS: Right fifth metatarsal fracture nonunion, Jones fracture  POSTOPERATIVE DIAGNOSIS: Same.  PROCEDURE: Open treatment internal fixation of right fifth metatarsal nonunion  SURGEON: N. Glee ArvinMichael Steele Stracener, M.D.  ASSIST: Starlyn SkeansMary Lindsey O'BrienStanbery, [place]PA-C; necessary for the timely completion of procedure and due to complexity of procedure.  ANESTHESIA:  general, regional  IV FLUIDS AND URINE: See anesthesia.  ESTIMATED BLOOD LOSS: minimal mL.  IMPLANTS: Arthrex 5.5 mm partially threaded screw, 50 mm  DRAINS: none  COMPLICATIONS: None.  DESCRIPTION OF PROCEDURE: The patient was brought to the operating room and placed supine on the operating table.  The patient had been signed prior to the procedure and this was documented. The patient had the anesthesia placed by the anesthesiologist.  A time-out was performed to confirm that this was the correct patient, site, side and location. The patient did receive antibiotics prior to the incision and was re-dosed during the procedure as needed at indicated intervals.  A tourniquet was placed.  The patient had the operative extremity prepped and draped in the standard surgical fashion.    An incision was made over the anterior medial tibial crest.  Dissection was carried down to the periosteum.  Subperiosteal elevation was performed.  A drill and osteotome were used to make a small cortical window for obtaining intramedullary bone graft and bone marrow aspirate.  The cortical window was then repaired by replacing the cortical bone back.  This was then irrigated and closed with 2.0 vicryl and 3.0 nylon.  We then turned our attention to the metatarsal fracture.  Fluoroscopy was used to identify the starting position at the base of the 5th  metatarsal.  The pin was then advanced down the 5th metatarsal canal on serial orthogonal fluoroscopic shots to the appropriate depth.  A small incision was made over the pin and blunt dissection was performed to avoid entrapment of peroneal tendons and sural nerve.  Tissue protector was then inserted.  The drill was then advanced over the pin.  The length of the screw was measured to be 50 mm.  Sequential tapping was performed up to 5.5 mm with excellent cortical contact and purchase.  The tap and pin were then removed and the screw was advanced down the canal to the appropriate depth.  The threads had excellent purchase and compression of the fracture was achieved.  The surgical wound was irrigated.  The bone graft and bone marrow aspirate were then placed in the fracture site.  This was then closed with 2.0 vicryl and 3.0 nylon.  Sterile dressings were applied.  Patient tolerated the procedure well and had no immediate complications.  POSTOPERATIVE PLAN: Non weight bearing in CAM boot.  Samuel ReelN. Michael [name], MD [facility] [phone] 11:05 AM

## 2018-04-12 NOTE — Progress Notes (Signed)
Assisted Dr. Turk with right, ultrasound guided, popliteal block. Side rails up, monitors on throughout procedure. See vital signs in flow sheet. Tolerated Procedure well. 

## 2018-04-12 NOTE — H&P (Signed)
PREOPERATIVE H&P  Chief Complaint: right 5th metatarsal nonunion  HPI: Samuel Sellers is a 42 y.o. male who presents for surgical treatment of right 5th metatarsal nonunion.  He denies any changes in medical history.  Past Medical History:  Diagnosis Date  . Metatarsal fracture    right 5th MT nonunion fx  . Pilonidal cyst   . Pre-diabetes   . Snores    Past Surgical History:  Procedure Laterality Date  . PILONIDAL CYST EXCISION  03/15/11  . TONSILLECTOMY     Social History   Socioeconomic History  . Marital status: Divorced    Spouse name: Not on file  . Number of children: Not on file  . Years of education: Not on file  . Highest education level: Not on file  Occupational History  . Not on file  Social Needs  . Financial resource strain: Not on file  . Food insecurity:    Worry: Not on file    Inability: Not on file  . Transportation needs:    Medical: Not on file    Non-medical: Not on file  Tobacco Use  . Smoking status: Former Research scientist (life sciences)  . Smokeless tobacco: Former Network engineer and Sexual Activity  . Alcohol use: Yes    Alcohol/week: 6.0 - 12.0 standard drinks    Types: 6 - 12 Cans of beer per week    Comment: social  . Drug use: No  . Sexual activity: Not on file  Lifestyle  . Physical activity:    Days per week: Not on file    Minutes per session: Not on file  . Stress: Not on file  Relationships  . Social connections:    Talks on phone: Not on file    Gets together: Not on file    Attends religious service: Not on file    Active member of club or organization: Not on file    Attends meetings of clubs or organizations: Not on file    Relationship status: Not on file  Other Topics Concern  . Not on file  Social History Narrative  . Not on file   Family History  Problem Relation Age of Onset  . Cancer Mother        breast  . Diabetes Mother   . Cancer Father        skin   Allergies  Allergen Reactions  . Erythromycin   .  Erythromycin Base Rash    AS A CHILD   Prior to Admission medications   Medication Sig Start Date End Date Taking? Authorizing Provider  traMADol (ULTRAM) 50 MG tablet Take 1-2 tablets (50-100 mg total) by mouth at bedtime as needed. 01/02/18  Yes Leandrew Koyanagi, MD  zinc sulfate 220 (50 Zn) MG capsule Take 1 capsule (220 mg total) by mouth daily. 01/02/18  Yes Leandrew Koyanagi, MD  glucose blood (FREESTYLE LITE) test strip Check BS bid 01/05/16   Susy Frizzle, MD  glucose monitoring kit (FREESTYLE) monitoring kit 1 each by Does not apply route as needed for other. 01/05/16   Susy Frizzle, MD  Lancets (FREESTYLE) lancets Check BS bid 01/05/16   Susy Frizzle, MD     Positive ROS: All other systems have been reviewed and were otherwise negative with the exception of those mentioned in the HPI and as above.  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   Eduard Roux, M.D.   04/12/2018 8:12 AM

## 2018-04-12 NOTE — Anesthesia Procedure Notes (Signed)
Anesthesia Regional Block: Popliteal block   Pre-Anesthetic Checklist: ,, timeout performed, Correct Patient, Correct Site, Correct Laterality, Correct Procedure, Correct Position, site marked, Risks and benefits discussed,  Surgical consent,  Pre-op evaluation,  At surgeon's request and post-op pain management  Laterality: Right  Prep: chloraprep       Needles:  Injection technique: Single-shot  Needle Type: Echogenic Needle     Needle Length: 9cm  Needle Gauge: 21     Additional Needles:   Procedures:,,,, ultrasound used (permanent image in chart),,,,  Narrative:  Start time: 04/12/2018 9:54 AM End time: 04/12/2018 9:59 AM Injection made incrementally with aspirations every 5 mL.  Performed by: Personally  Anesthesiologist: Cecile Hearingurk, Oktober Glazer Edward, MD  Additional Notes: No pain on injection. No increased resistance to injection. Injection made in 5cc increments.  Good needle visualization.  Patient tolerated procedure well.

## 2018-04-12 NOTE — Anesthesia Preprocedure Evaluation (Signed)
Anesthesia Evaluation  Patient identified by MRN, date of birth, ID band Patient awake    Reviewed: Allergy & Precautions, NPO status , Patient's Chart, lab work & pertinent test results  Airway Mallampati: II  TM Distance: >3 FB Neck ROM: Full    Dental  (+) Teeth Intact, Dental Advisory Given   Pulmonary former smoker,    Pulmonary exam normal breath sounds clear to auscultation       Cardiovascular Exercise Tolerance: Good negative cardio ROS Normal cardiovascular exam Rhythm:Regular Rate:Normal     Neuro/Psych negative neurological ROS  negative psych ROS   GI/Hepatic negative GI ROS, Neg liver ROS,   Endo/Other  Obesity   Renal/GU negative Renal ROS     Musculoskeletal negative musculoskeletal ROS (+)   Abdominal   Peds  Hematology negative hematology ROS (+)   Anesthesia Other Findings Day of surgery medications reviewed with the patient.  Reproductive/Obstetrics                             Anesthesia Physical Anesthesia Plan  ASA: II  Anesthesia Plan: General   Post-op Pain Management:  Regional for Post-op pain   Induction: Intravenous  PONV Risk Score and Plan: 2 and Dexamethasone, Ondansetron and Midazolam  Airway Management Planned: LMA  Additional Equipment:   Intra-op Plan:   Post-operative Plan: Extubation in OR  Informed Consent: I have reviewed the patients History and Physical, chart, labs and discussed the procedure including the risks, benefits and alternatives for the proposed anesthesia with the patient or authorized representative who has indicated his/her understanding and acceptance.   Dental advisory given  Plan Discussed with: CRNA  Anesthesia Plan Comments:         Anesthesia Quick Evaluation

## 2018-04-12 NOTE — Anesthesia Postprocedure Evaluation (Signed)
Anesthesia Post Note  Patient: Samuel PopperRichard C Sellers  Procedure(s) Performed: OPEN REDUCTION INTERNAL FIXATION (ORIF) RIGHT 5TH METATARSAL (TOE) FRACTURE (Right Foot)     Patient location during evaluation: PACU Anesthesia Type: General Level of consciousness: awake and alert, oriented and awake Pain management: pain level controlled Vital Signs Assessment: post-procedure vital signs reviewed and stable Respiratory status: spontaneous breathing, nonlabored ventilation and respiratory function stable Cardiovascular status: blood pressure returned to baseline and stable Postop Assessment: no apparent nausea or vomiting Anesthetic complications: no    Last Vitals:  Vitals:   04/12/18 1200 04/12/18 1215  BP: 116/73   Pulse: 74   Resp: 11   Temp:  (P) 36.9 C  SpO2: 95%     Last Pain:  Vitals:   04/12/18 1200  TempSrc:   PainSc: 0-No pain                 Cecile HearingStephen Edward Turk

## 2018-04-12 NOTE — Discharge Instructions (Signed)
Postoperative instructions:  Weightbearing instructions: non weight bearing  Keep your dressing and/or splint clean and dry at all times.  You can remove your dressing on post-operative day #3 and change with a dry/sterile dressing or Band-Aids as needed thereafter.    Incision instructions:  Do not soak your incision for 3 weeks after surgery.  If the incision gets wet, pat dry and do not scrub the incision.  Pain control:  You have been given a prescription to be taken as directed for post-operative pain control.  In addition, elevate the operative extremity above the heart at all times to prevent swelling and throbbing pain.  Take over-the-counter Colace, 100mg  by mouth twice a day while taking narcotic pain medications to help prevent constipation.  Follow up appointments: 1) 14 days for suture removal and wound check. 2) Dr. Roda ShuttersXu as scheduled.   -------------------------------------------------------------------------------------------------------------  After Surgery Pain Control:  After your surgery, post-surgical discomfort or pain is likely. This discomfort can last several days to a few weeks. At certain times of the day your discomfort may be more intense.  Did you receive a nerve block?  A nerve block can provide pain relief for one hour to two days after your surgery. As long as the nerve block is working, you will experience little or no sensation in the area the surgeon operated on.  As the nerve block wears off, you will begin to experience pain or discomfort. It is very important that you begin taking your prescribed pain medication before the nerve block fully wears off. Treating your pain at the first sign of the block wearing off will ensure your pain is better controlled and more tolerable when full-sensation returns. Do not wait until the pain is intolerable, as the medicine will be less effective. It is better to treat pain in advance than to try and catch up.  General  Anesthesia:  If you did not receive a nerve block during your surgery, you will need to start taking your pain medication shortly after your surgery and should continue to do so as prescribed by your surgeon.  Pain Medication:  Most commonly we prescribe Vicodin and Percocet for post-operative pain. Both of these medications contain a combination of acetaminophen (Tylenol) and a narcotic to help control pain.   It takes between 30 and 45 minutes before pain medication starts to work. It is important to take your medication before your pain level gets too intense.   Nausea is a common side effect of many pain medications. You will want to eat something before taking your pain medicine to help prevent nausea.   If you are taking a prescription pain medication that contains acetaminophen, we recommend that you do not take additional over the counter acetaminophen (Tylenol).  Other pain relieving options:   Using a cold pack to ice the affected area a few times a day (15 to 20 minutes at a time) can help to relieve pain, reduce swelling and bruising.   Elevation of the affected area can also help to reduce pain and swelling.      Post Anesthesia Home Care Instructions  Activity: Get plenty of rest for the remainder of the day. A responsible individual must stay with you for 24 hours following the procedure.  For the next 24 hours, DO NOT: -Drive a car -Advertising copywriterperate machinery -Drink alcoholic beverages -Take any medication unless instructed by your physician -Make any legal decisions or sign important papers.  Meals: Start with liquid foods such as  gelatin or soup. Progress to regular foods as tolerated. Avoid greasy, spicy, heavy foods. If nausea and/or vomiting occur, drink only clear liquids until the nausea and/or vomiting subsides. Call your physician if vomiting continues.  Special Instructions/Symptoms: Your throat may feel dry or sore from the anesthesia or the breathing tube  placed in your throat during surgery. If this causes discomfort, gargle with warm salt water. The discomfort should disappear within 24 hours.  If you had a scopolamine patch placed behind your ear for the management of post- operative nausea and/or vomiting:  1. The medication in the patch is effective for 72 hours, after which it should be removed.  Wrap patch in a tissue and discard in the trash. Wash hands thoroughly with soap and water. 2. You may remove the patch earlier than 72 hours if you experience unpleasant side effects which may include dry mouth, dizziness or visual disturbances. 3. Avoid touching the patch. Wash your hands with soap and water after contact with the patch.     Regional Anesthesia Blocks  1. Numbness or the inability to move the "blocked" extremity may last from 3-48 hours after placement. The length of time depends on the medication injected and your individual response to the medication. If the numbness is not going away after 48 hours, call your surgeon.  2. The extremity that is blocked will need to be protected until the numbness is gone and the  Strength has returned. Because you cannot feel it, you will need to take extra care to avoid injury. Because it may be weak, you may have difficulty moving it or using it. You may not know what position it is in without looking at it while the block is in effect.  3. For blocks in the legs and feet, returning to weight bearing and walking needs to be done carefully. You will need to wait until the numbness is entirely gone and the strength has returned. You should be able to move your leg and foot normally before you try and bear weight or walk. You will need someone to be with you when you first try to ensure you do not fall and possibly risk injury.  4. Bruising and tenderness at the needle site are common side effects and will resolve in a few days.  5. Persistent numbness or new problems with movement should be  communicated to the surgeon or the [facility]  [phone])/ [facility] [phone]).

## 2018-04-12 NOTE — Anesthesia Procedure Notes (Signed)
Procedure Name: LMA Insertion Date/Time: 04/12/2018 10:10 AM Performed by: Gar GibbonKeeton, Janessa Mickle S, CRNA Pre-anesthesia Checklist: Patient identified, Emergency Drugs available, Suction available and Patient being monitored Patient Re-evaluated:Patient Re-evaluated prior to induction Oxygen Delivery Method: Circle system utilized Preoxygenation: Pre-oxygenation with 100% oxygen Induction Type: IV induction Ventilation: Mask ventilation without difficulty LMA: LMA inserted LMA Size: 4.0 Number of attempts: 1 Airway Equipment and Method: Bite block Placement Confirmation: positive ETCO2 Tube secured with: Tape Dental Injury: Teeth and Oropharynx as per pre-operative assessment

## 2018-04-12 NOTE — Transfer of Care (Signed)
Immediate Anesthesia Transfer of Care Note  Patient: Samuel PopperRichard C [place]  Procedure(s) Performed: OPEN REDUCTION INTERNAL FIXATION (ORIF) RIGHT 5TH METATARSAL (TOE) FRACTURE (Right Foot)  Patient Location: PACU  Anesthesia Type:GA combined with regional for post-op pain  Level of Consciousness: awake, sedated and patient cooperative  Airway & Oxygen Therapy: Patient Spontanous Breathing and Patient connected to face mask oxygen  Post-op Assessment: Report given to RN and Post -op Vital signs reviewed and stable  Post vital signs: Reviewed and stable  Last Vitals:  Vitals Value Taken Time  BP 107/64 04/12/2018 11:30 AM  Temp    Pulse 84 04/12/2018 11:30 AM  Resp 14 04/12/2018 11:30 AM  SpO2 95 % 04/12/2018 11:30 AM  Vitals shown include unvalidated device data.  Last Pain:  Vitals:   04/12/18 0928  TempSrc: Oral  PainSc: 0-No pain      Patients Stated Pain Goal: 0 (04/12/18 [id number])  Complications: No apparent anesthesia complications

## 2018-04-13 ENCOUNTER — Encounter (HOSPITAL_BASED_OUTPATIENT_CLINIC_OR_DEPARTMENT_OTHER): Payer: Self-pay | Admitting: Orthopaedic Surgery

## 2018-04-13 NOTE — Addendum Note (Signed)
Addendum  created 04/13/18 0956 by Lance CoonWebster, [place], CRNA   Charge Capture section accepted

## 2018-04-21 ENCOUNTER — Telehealth (INDEPENDENT_AMBULATORY_CARE_PROVIDER_SITE_OTHER): Payer: Self-pay | Admitting: Orthopaedic Surgery

## 2018-04-21 NOTE — Telephone Encounter (Signed)
Patient had surgery on 8/21, hes wondering if his bandages need to be changed before his post op appt on 9/5? Please advise # [phone]

## 2018-04-21 NOTE — Telephone Encounter (Signed)
I called patient. His dressing has come so loose from using the knee scooter that he can see his stitches. Everything looks okay and he is having very little pain.  I did advise he can rewrap the leg.  Patient is also having to go on an emergent trip due to his mother in law passing away.  He wanted to know if there were any recommendations for the long trip. I advised to elevate and stop often to get out of the car and move around.

## 2018-04-25 ENCOUNTER — Other Ambulatory Visit (INDEPENDENT_AMBULATORY_CARE_PROVIDER_SITE_OTHER): Payer: Self-pay

## 2018-04-25 ENCOUNTER — Telehealth (INDEPENDENT_AMBULATORY_CARE_PROVIDER_SITE_OTHER): Payer: Self-pay | Admitting: Orthopaedic Surgery

## 2018-04-25 MED ORDER — PREDNISONE 10 MG (21) PO TBPK: ORAL_TABLET | ORAL | 0 refills | Status: DC

## 2018-04-25 MED ORDER — METHOCARBAMOL 750 MG PO TABS: 750.0000 mg | ORAL_TABLET | Freq: Two times a day (BID) | ORAL | 0 refills | Status: DC | PRN

## 2018-04-25 NOTE — Telephone Encounter (Signed)
Sure.  Pred pak, robaxin 750 mg bid prn

## 2018-04-25 NOTE — Telephone Encounter (Signed)
See message below °
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
## 2018-04-25 NOTE — Telephone Encounter (Signed)
BOTH RX CALLED INTO PHARM. PATIENT AWARE.

## 2018-04-26 ENCOUNTER — Inpatient Hospital Stay (INDEPENDENT_AMBULATORY_CARE_PROVIDER_SITE_OTHER): Payer: Self-pay | Admitting: Orthopaedic Surgery

## 2018-04-26 ENCOUNTER — Other Ambulatory Visit (INDEPENDENT_AMBULATORY_CARE_PROVIDER_SITE_OTHER): Payer: Self-pay

## 2018-04-26 MED ORDER — PREDNISONE 10 MG (21) PO TBPK: ORAL_TABLET | ORAL | 0 refills | Status: DC

## 2018-04-26 NOTE — Telephone Encounter (Signed)
Patient went to pharmacy and only one RX there (ROBAXIN) the PREDNISONE was not there.  Please call patient to advise.

## 2018-04-26 NOTE — Telephone Encounter (Signed)
Refaxed Rx. Called patient no answer LMOM with details.

## 2018-04-27 ENCOUNTER — Encounter (INDEPENDENT_AMBULATORY_CARE_PROVIDER_SITE_OTHER): Payer: Self-pay | Admitting: Orthopaedic Surgery

## 2018-04-27 ENCOUNTER — Ambulatory Visit (INDEPENDENT_AMBULATORY_CARE_PROVIDER_SITE_OTHER): Payer: Worker's Compensation | Admitting: Orthopaedic Surgery

## 2018-04-27 ENCOUNTER — Ambulatory Visit (INDEPENDENT_AMBULATORY_CARE_PROVIDER_SITE_OTHER): Payer: Self-pay

## 2018-04-27 DIAGNOSIS — S92354A Nondisplaced fracture of fifth metatarsal bone, right foot, initial encounter for closed fracture: Secondary | ICD-10-CM

## 2018-04-27 MED ORDER — OXYCODONE HCL 5 MG PO TABS: 5.0000 mg | ORAL_TABLET | Freq: Two times a day (BID) | ORAL | 0 refills | Status: DC | PRN

## 2018-04-27 NOTE — Progress Notes (Signed)
   Post-Op Visit Note   Patient: Samuel Sellers           Date of Birth: [birth year]           MRN: [id number] Visit Date: 04/27/2018 PCP: Dorena Bodo, PA-C   Assessment & Plan:  Chief Complaint:  Chief Complaint  Patient presents with  . Right Foot - Pain, Routine Post Op   Visit Diagnoses:  1. Nondisplaced fracture of fifth metatarsal bone, right foot, initial encounter for closed fracture     Plan: Samuel Sellers is 2-week status post screw fixation of nonunion Jones fracture.  Overall he is doing well and reports some soreness.  He does take oxycodone as needed for the pain.  His surgical incisions have all healed without any signs of infection.  He does have some postoperative subcutaneous hematoma near the bone graft site.  There is no drainage.  Today we remove the sutures.  Continue nonweightbearing.  I recommend warm compresses to the donor site.  Recheck in 4 weeks with three-view x-rays of the right foot.  Continue out of work for now.  Work note was provided today.  Follow-Up Instructions: Return in about 4 weeks (around 05/25/2018).   Orders:  Orders Placed This Encounter  Procedures  . XR Foot Complete Right   Meds ordered this encounter  Medications  . oxyCODONE (OXY IR/ROXICODONE) 5 MG immediate release tablet    Sig: Take 1 tablet (5 mg total) by mouth 2 (two) times daily as needed.    Dispense:  20 tablet    Refill:  0    Imaging: Xr Foot Complete Right  Result Date: 04/27/2018 Stable intramedullary screw fixation of 5th metatarsal fracture   PMFS History: Patient Active Problem List   Diagnosis Date Noted  . 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 Injury of left foot 06/24/2015  . Pilonidal cyst 02/17/2011   Past Medical History:  Diagnosis Date  . Metatarsal fracture    right 5th MT nonunion fx  . Pilonidal cyst   . Pre-diabetes   . Snores     Family History  Problem Relation Age of Onset  .  Cancer Mother        breast  . Diabetes Mother   . Cancer Father        skin    Past Surgical History:  Procedure Laterality Date  . ORIF TOE FRACTURE Right 04/12/2018   Procedure: OPEN REDUCTION INTERNAL FIXATION (ORIF) RIGHT 5TH METATARSAL (TOE) FRACTURE;  Surgeon: Tarry Kos, MD;  Location: [facility];  Service: Orthopedics;  Laterality: Right;  . PILONIDAL CYST EXCISION  03/15/11  . TONSILLECTOMY     Social History   Occupational History  . Not on file  Tobacco Use  . Smoking status: Former Games developer  . Smokeless tobacco: Former Engineer, water and Sexual Activity  . Alcohol use: Yes    Alcohol/week: 6.0 - 12.0 standard drinks    Types: 6 - 12 Cans of beer per week    Comment: social  . Drug use: No  . Sexual activity: Not on file

## 2018-05-26 ENCOUNTER — Ambulatory Visit (INDEPENDENT_AMBULATORY_CARE_PROVIDER_SITE_OTHER): Payer: Worker's Compensation

## 2018-05-26 ENCOUNTER — Ambulatory Visit (INDEPENDENT_AMBULATORY_CARE_PROVIDER_SITE_OTHER): Payer: Worker's Compensation | Admitting: Orthopaedic Surgery

## 2018-05-26 ENCOUNTER — Encounter (INDEPENDENT_AMBULATORY_CARE_PROVIDER_SITE_OTHER): Payer: Self-pay | Admitting: Orthopaedic Surgery

## 2018-05-26 VITALS — Ht 76.0 in | Wt 290.6 lb

## 2018-05-26 DIAGNOSIS — M79671 Pain in right foot: Secondary | ICD-10-CM

## 2018-05-26 DIAGNOSIS — S92351K Displaced fracture of fifth metatarsal bone, right foot, subsequent encounter for fracture with nonunion: Secondary | ICD-10-CM

## 2018-05-26 NOTE — Progress Notes (Signed)
   Post-Op Visit Note   Patient: Samuel Sellers           Date of Birth: Dec 29, 1975           MRN: [id number] Visit Date: 05/26/2018 PCP: Dorena Bodo, PA-C   Assessment & Plan:  Chief Complaint:  Chief Complaint  Patient presents with  . Right Foot - Routine Post Op   Visit Diagnoses:  1. 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 Pain in right foot     Plan: Login is 6 weeks status post intramedullary screw fixation for his Jones fracture nonunion.  He reports no pain.  His physical exam is benign.  He has minimal tenderness over the surgical scar.  He has been compliant with a Cam walker.  His x-rays today demonstrate that he is healing the fracture with bony consolidation and callus formation.  At this point we will advance him to protected weightbearing with a short cam boot and a single crutch for another 6 weeks.  Referral to physical therapy was made for gait training strengthening.  Recheck in 6 weeks with three-view x-rays of the right foot.  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  Follow-Up Instructions: Return in about 6 weeks (around 07/07/2018).   Orders:  Orders Placed This Encounter  Procedures  . XR Foot Complete Right   No orders of the defined types were placed in this encounter.   Imaging: Xr Foot Complete Right  Result Date: 05/26/2018 Stable fixation of fifth metatarsal fracture.  Fracture site appears to be healing with callus formation and bony consolidation.   PMFS History: Patient Active Problem List   Diagnosis Date Noted  . 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 Injury of left foot 06/24/2015  . Pilonidal cyst 02/17/2011   Past Medical History:  Diagnosis Date  . Metatarsal fracture    right 5th MT nonunion fx  . Pilonidal cyst   . Pre-diabetes   . Snores     Family History  Problem Relation Age of Onset  . Cancer Mother        breast  .  Diabetes Mother   . Cancer Father        skin    Past Surgical History:  Procedure Laterality Date  . ORIF TOE FRACTURE Right 04/12/2018   Procedure: OPEN REDUCTION INTERNAL FIXATION (ORIF) RIGHT 5TH METATARSAL (TOE) FRACTURE;  Surgeon: Tarry Kos, MD;  Location: [facility];  Service: Orthopedics;  Laterality: Right;  . PILONIDAL CYST EXCISION  03/15/11  . TONSILLECTOMY     Social History   Occupational History  . Not on file  Tobacco Use  . Smoking status: Former Games developer  . Smokeless tobacco: Former Engineer, water and Sexual Activity  . Alcohol use: Yes    Alcohol/week: 6.0 - 12.0 standard drinks    Types: 6 - 12 Cans of beer per week    Comment: social  . Drug use: No  . Sexual activity: Not on file

## 2018-05-30 ENCOUNTER — Telehealth (INDEPENDENT_AMBULATORY_CARE_PROVIDER_SITE_OTHER): Payer: Self-pay

## 2018-05-30 NOTE — Telephone Encounter (Signed)
FAXED

## 2018-05-30 NOTE — Telephone Encounter (Signed)
Tammy from [facility] Called stating that they have referral authorization from work comp to see patient but they do not have an order/prescription for P.T. She is asking for this to be faxed to them  Fax: [phone]

## 2018-05-31 ENCOUNTER — Telehealth (INDEPENDENT_AMBULATORY_CARE_PROVIDER_SITE_OTHER): Payer: Self-pay

## 2018-05-31 NOTE — Telephone Encounter (Signed)
Samuel Sellers from [facility] called and left voicemail on Triage phone. Would like for [place] to fax op notes,xrays reports to fax number [phone]  This has been done.

## 2018-06-05 ENCOUNTER — Telehealth (INDEPENDENT_AMBULATORY_CARE_PROVIDER_SITE_OTHER): Payer: Self-pay

## 2018-06-05 NOTE — Telephone Encounter (Signed)
Emailed the 05/26/18 office note and work note to case mgr per her request

## 2018-07-07 ENCOUNTER — Ambulatory Visit (INDEPENDENT_AMBULATORY_CARE_PROVIDER_SITE_OTHER): Payer: Worker's Compensation

## 2018-07-07 ENCOUNTER — Ambulatory Visit (INDEPENDENT_AMBULATORY_CARE_PROVIDER_SITE_OTHER): Payer: Worker's Compensation | Admitting: Orthopaedic Surgery

## 2018-07-07 ENCOUNTER — Encounter (INDEPENDENT_AMBULATORY_CARE_PROVIDER_SITE_OTHER): Payer: Self-pay | Admitting: Orthopaedic Surgery

## 2018-07-07 DIAGNOSIS — S92351K Displaced fracture of fifth metatarsal bone, right foot, subsequent encounter for fracture with nonunion: Secondary | ICD-10-CM

## 2018-07-07 NOTE — Progress Notes (Signed)
   Post-Op Visit Note   Patient: Melburn PopperRichard C Willaims           Date of Birth: 04/05/1976           MRN: [id number] Visit Date: 07/07/2018 PCP: Dorena Bodoixon, Mary B, PA-C   Assessment & Plan:  Chief Complaint:  Chief Complaint  Patient presents with  . Right Foot - Follow-up   Visit Diagnoses:  1. 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  He is doing well overall.  Clinically he reports no pain just mainly some soreness.  He has been ambulating with a Cam walker.  He has no tenderness palpation on exam.  His x-rays demonstrate considerable improvement in bony consolidation and healing.  There is no hardware complication.  Overall this is very encouraging and he is healing well.  At this point he can discontinue the cam boot.  Continue with physical therapy out of the boot for ankle strengthening and then 4 weeks of work conditioning after that.  I would like to see him back in 7 weeks with three-view x-rays of the right foot.  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  Follow-Up Instructions: Return in about 7 weeks (around 08/25/2018).   Orders:  Orders Placed This Encounter  Procedures  . XR Foot Complete Right   No orders of the defined types were placed in this encounter.   Imaging: Xr Foot Complete Right  Result Date: 07/07/2018 Significant improvement in bony consolidation and evidence of continued healing.  Stable hardware.   PMFS History: Patient Active Problem List   Diagnosis Date Noted  . 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 Injury of left foot 06/24/2015  . Pilonidal cyst 02/17/2011   Past Medical History:  Diagnosis Date  . Metatarsal fracture    right 5th MT nonunion fx  . Pilonidal cyst   . Pre-diabetes   . Snores     Family History  Problem Relation Age of Onset  . Cancer Mother      breast  . Diabetes Mother   . Cancer Father        skin    Past Surgical History:  Procedure Laterality Date  . ORIF TOE FRACTURE Right 04/12/2018   Procedure: OPEN REDUCTION INTERNAL FIXATION (ORIF) RIGHT 5TH METATARSAL (TOE) FRACTURE;  Surgeon: Tarry KosXu, Naiping M, MD;  Location: [facility];  Service: Orthopedics;  Laterality: Right;  . PILONIDAL CYST EXCISION  03/15/11  . TONSILLECTOMY     Social History   Occupational History  . Not on file  Tobacco Use  . Smoking status: Former Games developermoker  . Smokeless tobacco: Former Engineer, waterUser  Substance and Sexual Activity  . Alcohol use: Yes    Alcohol/week: 6.0 - 12.0 standard drinks    Types: 6 - 12 Cans of beer per week    Comment: social  . Drug use: No  . Sexual activity: Not on file

## 2018-07-11 ENCOUNTER — Telehealth (INDEPENDENT_AMBULATORY_CARE_PROVIDER_SITE_OTHER): Payer: Self-pay

## 2018-07-11 NOTE — Telephone Encounter (Signed)
Emailed the 07/07/18 office note to case mgr per her request

## 2018-08-29 ENCOUNTER — Ambulatory Visit (INDEPENDENT_AMBULATORY_CARE_PROVIDER_SITE_OTHER): Payer: Worker's Compensation | Admitting: Orthopaedic Surgery

## 2018-08-29 ENCOUNTER — Ambulatory Visit (INDEPENDENT_AMBULATORY_CARE_PROVIDER_SITE_OTHER): Payer: Worker's Compensation

## 2018-08-29 DIAGNOSIS — M79671 Pain in right foot: Secondary | ICD-10-CM | POA: Diagnosis not present

## 2018-08-29 DIAGNOSIS — S92351K Displaced fracture of fifth metatarsal bone, right foot, subsequent encounter for fracture with nonunion: Secondary | ICD-10-CM | POA: Diagnosis not present

## 2018-08-29 NOTE — Progress Notes (Signed)
Office Visit Note   Patient: Samuel Sellers           Date of Birth: [birth year]           MRN: [id number] Visit Date: 08/29/2018              Requested by: Dorena Bodoixon, Mary B, PA-C No address on file PCP: Dorena Bodoixon, Mary B, PA-C   Assessment & Plan: Visit Diagnoses:  1. 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 Pain in right foot     Plan: At this point Samuel Sellers has demonstrated clinical and radiographic healing of his Jones fracture status post intramedullary fixation.  He has done well.  He is released back to full activity and duty without restrictions effective immediately.  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  Today's encounter was done with the case manager present also.  We will see him back as needed.  Questions encouraged and answered. Total face to face encounter time was greater than 25 minutes and over half of this time was spent in counseling and/or coordination of care.  Follow-Up Instructions: Return if symptoms worsen or fail to improve.   Orders:  Orders Placed This Encounter  Procedures  . XR Foot Complete Right   No orders of the defined types were placed in this encounter.     Procedures: No procedures performed   Clinical Data: No additional findings.   Subjective: Chief Complaint  Patient presents with  . Right Foot - Pain    Samuel Sellers is 5 months status post intramedullary fixation of a Jones fracture nonunion.  He is doing well reports no pain.  He has completed physical therapy as well as work conditioning.  He is ready to go back to work.  He only endorses numbness and tingling around the surgical incision.  He is able to weight-bear without any pain.  He has no swelling.   Review of Systems   Objective: Vital Signs: There were no vitals taken for this  visit.  Physical Exam  Ortho Exam Right foot exam shows fully healed surgical scar.  There is no tenderness palpation or swelling.  No neurovascular compromise to the right foot. Specialty Comments:  No specialty comments available.  Imaging: Xr Foot Complete Right  Result Date: 08/29/2018 Healed fifth metatarsal fracture with stable intramedullary fixation.    PMFS History: Patient Active Problem List   Diagnosis Date Noted  . 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 Injury of left foot 06/24/2015  . Pilonidal cyst 02/17/2011   Past Medical History:  Diagnosis Date  . Metatarsal fracture    right 5th MT nonunion fx  . Pilonidal cyst   . Pre-diabetes   . Snores     Family History  Problem Relation Age of Onset  . Cancer Mother        breast  . Diabetes Mother   . Cancer Father        skin    Past Surgical History:  Procedure Laterality Date  . ORIF TOE FRACTURE Right 04/12/2018   Procedure: OPEN REDUCTION INTERNAL FIXATION (ORIF) RIGHT 5TH METATARSAL (TOE) FRACTURE;  Surgeon: Tarry KosXu, Naiping M, MD;  Location: [facility];  Service: Orthopedics;  Laterality: Right;  .  PILONIDAL CYST EXCISION  03/15/11  . TONSILLECTOMY     Social History   Occupational History  . Not on file  Tobacco Use  . Smoking status: Former Games developermoker  . Smokeless tobacco: Former Engineer, waterUser  Substance and Sexual Activity  . Alcohol use: Yes    Alcohol/week: 6.0 - 12.0 standard drinks    Types: 6 - 12 Cans of beer per week    Comment: social  . Drug use: No  . Sexual activity: Not on file

## 2018-09-13 ENCOUNTER — Telehealth (INDEPENDENT_AMBULATORY_CARE_PROVIDER_SITE_OTHER): Payer: Self-pay

## 2018-09-13 NOTE — Telephone Encounter (Signed)
Emailed the 08/29/18 office note to the case mgr per her request

## 2018-09-15 ENCOUNTER — Telehealth (INDEPENDENT_AMBULATORY_CARE_PROVIDER_SITE_OTHER): Payer: Self-pay

## 2018-09-15 NOTE — Telephone Encounter (Signed)
Emailed completed 25r form to case mgr per her request 20% rating to the foot marked

## 2018-09-27 ENCOUNTER — Telehealth (INDEPENDENT_AMBULATORY_CARE_PROVIDER_SITE_OTHER): Payer: Self-pay | Admitting: Orthopaedic Surgery

## 2018-09-27 NOTE — Telephone Encounter (Signed)
PT called requesting to speak to liz concernign  some paperwork  PT number --[phone]

## 2018-09-29 NOTE — Telephone Encounter (Signed)
Called and spoke to patient 2 days ago. Pending Fax. They were going to resend.

## 2018-10-09 ENCOUNTER — Encounter (INDEPENDENT_AMBULATORY_CARE_PROVIDER_SITE_OTHER): Payer: Self-pay | Admitting: Orthopaedic Surgery

## 2018-10-10 ENCOUNTER — Telehealth (INDEPENDENT_AMBULATORY_CARE_PROVIDER_SITE_OTHER): Payer: Self-pay | Admitting: Orthopaedic Surgery

## 2018-10-10 NOTE — Telephone Encounter (Signed)
Hope with [employer] called advised she faxed a form concerning the patient 09/28/2018. Hope asked if the fax was received and asked for a call back. The number to contact [facility] is [phone] EXT: [phone]

## 2018-10-11 ENCOUNTER — Telehealth (INDEPENDENT_AMBULATORY_CARE_PROVIDER_SITE_OTHER): Payer: Self-pay | Admitting: Orthopaedic Surgery

## 2018-10-11 ENCOUNTER — Ambulatory Visit (INDEPENDENT_AMBULATORY_CARE_PROVIDER_SITE_OTHER): Payer: Worker's Compensation | Admitting: Orthopaedic Surgery

## 2018-10-11 DIAGNOSIS — S92351K Displaced fracture of fifth metatarsal bone, right foot, subsequent encounter for fracture with nonunion: Secondary | ICD-10-CM | POA: Diagnosis not present

## 2018-10-11 NOTE — Telephone Encounter (Signed)
Tried to call patient no answer LMOM to return my call. 

## 2018-10-11 NOTE — Telephone Encounter (Signed)
Letter ready for pick up. AMA Format.

## 2018-10-11 NOTE — Telephone Encounter (Signed)
Patient called asked for a call back concerning the paperwork that was completed for his foot.  The number to contact patient is [phone]

## 2018-10-11 NOTE — Telephone Encounter (Signed)
FAXED TO [employer] [phone]

## 2018-10-11 NOTE — Telephone Encounter (Signed)
See other message

## 2018-10-11 NOTE — Telephone Encounter (Signed)
Called patient back and he states he is on the way here regarding paperwork

## 2018-10-11 NOTE — Telephone Encounter (Signed)
Patient came to speak to Dr Roda Shutters In office regarding this

## 2018-10-11 NOTE — Progress Notes (Signed)
Office Visit Note   Patient: Samuel Sellers           Date of Birth: [birth year]           MRN: [id number] Visit Date: 10/11/2018              Requested by: Dorena Bodo, PA-C No address on file PCP: Dorena Bodo, PA-C   Assessment & Plan: Visit Diagnoses:  1. 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  After review of the [employer] guide his permanent partial impairment is 20%.  10% for the surgery and 10% for the limitation in his subtalar joint.  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  Follow-up as needed.  Total face to face encounter time was greater than 25 minutes and over half of this time was spent in counseling and/or coordination of care.  Follow-Up Instructions: Return if symptoms worsen or fail to improve.   Orders:  No orders of the defined types were placed in this encounter.  No orders of the defined types were placed in this encounter.     Procedures: No procedures performed   Clinical Data: No additional findings.   Subjective: No chief complaint on file.   Jacarious is here for his final visit for his impairment rating.  I have received the request to write his impairment based on the AMA guidelines and I do not feel comfortable giving him a rating based on this.  Therefore I will use the [employer] which is more applicable to his condition.   Review of Systems   Objective: Vital Signs: There were no vitals taken for this visit.  Physical Exam  Ortho Exam Right foot exam shows fully healed surgical scar.  He has no ankylosis or impairments regarding his ankle. In terms of the right foot he has eversion and inversion of approximately 20 degrees each way.  He has no swelling. Specialty Comments:  No specialty comments available.  Imaging: No results found.   PMFS History: Patient  Active Problem List   Diagnosis Date Noted  . 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 Injury of left foot 06/24/2015  . Pilonidal cyst 02/17/2011   Past Medical History:  Diagnosis Date  . Metatarsal fracture    right 5th MT nonunion fx  . Pilonidal cyst   . Pre-diabetes   . Snores     Family History  Problem Relation Age of Onset  . Cancer Mother        breast  . Diabetes Mother   . Cancer Father        skin    Past Surgical History:  Procedure Laterality Date  . ORIF TOE FRACTURE Right 04/12/2018   Procedure: OPEN REDUCTION INTERNAL FIXATION (ORIF) RIGHT 5TH METATARSAL (TOE) FRACTURE;  Surgeon: Tarry Kos, MD;  Location: [facility];  Service: Orthopedics;  Laterality: Right;  . PILONIDAL CYST EXCISION  03/15/11  . TONSILLECTOMY     Social History   Occupational History  . Not on file  Tobacco Use  . Smoking status: Former Games developer  . Smokeless tobacco: Former Engineer, water and Sexual Activity  . Alcohol use: Yes    Alcohol/week: 6.0 - 12.0 standard drinks    Types: 6 - 12 Cans of beer per week  Comment: social  . Drug use: No  . Sexual activity: Not on file

## 2020-05-13 ENCOUNTER — Other Ambulatory Visit: Payer: Self-pay | Admitting: Orthopedic Surgery

## 2020-05-13 DIAGNOSIS — M542 Cervicalgia: Secondary | ICD-10-CM

## 2020-06-11 ENCOUNTER — Other Ambulatory Visit: Payer: Self-pay

## 2020-06-11 ENCOUNTER — Ambulatory Visit
Admission: RE | Admit: 2020-06-11 | Discharge: 2020-06-11 | Disposition: A | Discharge status: Home / Self Care | Payer: 59 | Source: Ambulatory Visit | Attending: Orthopedic Surgery | Admitting: Orthopedic Surgery

## 2020-06-11 DIAGNOSIS — M542 Cervicalgia: Secondary | ICD-10-CM

## 2021-12-15 ENCOUNTER — Telehealth: Payer: Self-pay | Admitting: Oncology

## 2021-12-15 NOTE — Telephone Encounter (Signed)
Patient called and left voicemail inquiring about how to obtain an appt for Hem/Onc. No answer and his voicemail was full so unable to leave message. We need to advise that he needs to have PCP or a referring provider submit a referral for him plus per his address suggest sister locations. ?

## 2022-06-08 ENCOUNTER — Ambulatory Visit: Payer: PRIVATE HEALTH INSURANCE | Admitting: Nurse Practitioner

## 2022-06-08 ENCOUNTER — Encounter: Payer: Self-pay | Admitting: Nurse Practitioner

## 2022-06-08 VITALS — BP 132/89 | HR 72 | Temp 98.7°F | Ht 77.0 in | Wt 304.0 lb

## 2022-06-08 DIAGNOSIS — E119 Type 2 diabetes mellitus without complications: Secondary | ICD-10-CM | POA: Diagnosis not present

## 2022-06-08 DIAGNOSIS — Z0001 Encounter for general adult medical examination with abnormal findings: Secondary | ICD-10-CM | POA: Diagnosis not present

## 2022-06-08 DIAGNOSIS — M79672 Pain in left foot: Secondary | ICD-10-CM | POA: Diagnosis not present

## 2022-06-08 DIAGNOSIS — Z0000 Encounter for general adult medical examination without abnormal findings: Secondary | ICD-10-CM

## 2022-06-08 LAB — BAYER DCA HB A1C WAIVED: HB A1C (BAYER DCA - WAIVED): 7.1 % — ABNORMAL HIGH (ref 4.8–5.6)

## 2022-06-08 NOTE — Patient Instructions (Signed)
Health Maintenance, Male Adopting a healthy lifestyle and getting preventive care are important in promoting health and wellness. Ask your health care provider about: The right schedule for you to have regular tests and exams. Things you can do on your own to prevent diseases and keep yourself healthy. What should I know about diet, weight, and exercise? Eat a healthy diet  Eat a diet that includes plenty of vegetables, fruits, low-fat dairy products, and lean protein. Do not eat a lot of foods that are high in solid fats, added sugars, or sodium. Maintain a healthy weight Body mass index (BMI) is a measurement that can be used to identify possible weight problems. It estimates body fat based on height and weight. Your health care provider can help determine your BMI and help you achieve or maintain a healthy weight. Get regular exercise Get regular exercise. This is one of the most important things you can do for your health. Most adults should: Exercise for at least 150 minutes each week. The exercise should increase your heart rate and make you sweat (moderate-intensity exercise). Do strengthening exercises at least twice a week. This is in addition to the moderate-intensity exercise. Spend less time sitting. Even light physical activity can be beneficial. Watch cholesterol and blood lipids Have your blood tested for lipids and cholesterol at 48 years of age, then have this test every 5 years. You may need to have your cholesterol levels checked more often if: Your lipid or cholesterol levels are high. You are older than 48 years of age. You are at high risk for heart disease. What should I know about cancer screening? Many types of cancers can be detected early and may often be prevented. Depending on your health history and family history, you may need to have cancer screening at various ages. This may include screening for: Colorectal cancer. Prostate cancer. Skin cancer. Lung  cancer. What should I know about heart disease, diabetes, and high blood pressure? Blood pressure and heart disease High blood pressure causes heart disease and increases the risk of stroke. This is more likely to develop in people who have high blood pressure readings or are overweight. Talk with your health care provider about your target blood pressure readings. Have your blood pressure checked: Every 3-5 years if you are 18-39 years of age. Every year if you are 40 years old or older. If you are between the ages of 65 and 75 and are a current or former smoker, ask your health care provider if you should have a one-time screening for abdominal aortic aneurysm (AAA). Diabetes Have regular diabetes screenings. This checks your fasting blood sugar level. Have the screening done: Once every three years after age 45 if you are at a normal weight and have a low risk for diabetes. More often and at a younger age if you are overweight or have a high risk for diabetes. What should I know about preventing infection? Hepatitis B If you have a higher risk for hepatitis B, you should be screened for this virus. Talk with your health care provider to find out if you are at risk for hepatitis B infection. Hepatitis C Blood testing is recommended for: Everyone born from 1945 through 1965. Anyone with known risk factors for hepatitis C. Sexually transmitted infections (STIs) You should be screened each year for STIs, including gonorrhea and chlamydia, if: You are sexually active and are younger than 48 years of age. You are older than 48 years of age and your   health care provider tells you that you are at risk for this type of infection. Your sexual activity has changed since you were last screened, and you are at increased risk for chlamydia or gonorrhea. Ask your health care provider if you are at risk. Ask your health care provider about whether you are at high risk for HIV. Your health care provider  may recommend a prescription medicine to help prevent HIV infection. If you choose to take medicine to prevent HIV, you should first get tested for HIV. You should then be tested every 3 months for as long as you are taking the medicine. Follow these instructions at home: Alcohol use Do not drink alcohol if your health care provider tells you not to drink. If you drink alcohol: Limit how much you have to 0-2 drinks a day. Know how much alcohol is in your drink. In the [place] one drink equals one 12 oz bottle of beer (355 mL), one 5 oz glass of wine (148 mL), or one 1 oz glass of hard liquor (44 mL). Lifestyle Do not use any products that contain nicotine or tobacco. These products include cigarettes, chewing tobacco, and vaping devices, such as e-cigarettes. If you need help quitting, ask your health care provider. Do not use street drugs. Do not share needles. Ask your health care provider for help if you need support or information about quitting drugs. General instructions Schedule regular health, dental, and eye exams. Stay current with your vaccines. Tell your health care provider if: You often feel depressed. You have ever been abused or do not feel safe at home. Summary Adopting a healthy lifestyle and getting preventive care are important in promoting health and wellness. Follow your health care provider's instructions about healthy diet, exercising, and getting tested or screened for diseases. Follow your health care provider's instructions on monitoring your cholesterol and blood pressure. This information is not intended to replace advice given to you by your health care provider. Make sure you discuss any questions you have with your health care provider. Document Revised: 12/29/2020 Document Reviewed: 12/29/2020 Elsevier Patient Education  2023 [employer] Inc.  

## 2022-06-08 NOTE — Assessment & Plan Note (Signed)
Completed head to toe assessment, provided education on health maintenance and preventative care. Education provided with printed hand out give. Labs completed at work . Patient will fax in value. A1c completed results pending. Colonoscopy referral completed to GI,

## 2022-06-08 NOTE — Assessment & Plan Note (Signed)
Patient had a history of diabetes, with an A1c of 11 and started loosing weight and exeresis and came off of all medication with an A1c of 5.4. patient is in clinic today to recheck diabetes since he has gained weight back and blood sugar this morning is 130

## 2022-06-08 NOTE — Assessment & Plan Note (Signed)
podiatry referral completed for left foot pain

## 2022-06-08 NOTE — Progress Notes (Signed)
New Patient Note  RE: DATON SZILAGYI MRN: [id number] DOB: [birth year] Date of Office Visit: 06/08/2022  Chief Complaint: Establish Care, Diabetes, and Foot Pain  History of Present Illness: .   Encounter for general adult medical examination without abnormal findings  Physical: Patient's last physical exam was 1 year ago .  Weight: Not Appropriate for height (BMI greater than 27%) ;  Blood Pressure: Normal (BP less than 120/80) ;  Medical History: Patient history reviewed ; Family history reviewed ;  Allergies Reviewed: No change in current allergies ;  Medications Reviewed: Medications reviewed - no changes ;  Lipids: Normal lipid levels ; labs completed results pending Smoking: Life-long non-smoker ;  Physical Activity: Exercises at least 3 times per week ;  Alcohol/Drug Use: Is a non-drinker ; No illicit drug use ;  Patient is not afflicted from Stress Incontinence and Urge Incontinence  Safety: reviewed ; Patient wears a seat belt, has smoke detectors, has carbon monoxide detectors, practices appropriate gun safety, and wears sunscreen with extended sun exposure. Dental Care: aannual cleanings, brushes and flosses daily. Ophthalmology/Optometry: Annual visit.  Hearing loss: none Vision impairments: none   Diabetes He presents for his follow-up diabetic visit. He has type 2 diabetes mellitus. The initial diagnosis of diabetes was made 2 years ago. His disease course has been improving. There are no hypoglycemic associated symptoms. Pertinent negatives for diabetes include no chest pain, no fatigue, no foot ulcerations, no polydipsia, no polyphagia and no weight loss. There are no hypoglycemic complications. Symptoms are improving. There are no diabetic complications. Risk factors for coronary artery disease include male sex and obesity. When asked about current treatments, none were reported. He is following a generally healthy diet. When asked about meal planning, he reported none.  He has not had a previous visit with a dietitian. He monitors urine at home 1-2 x per day. Blood glucose monitoring compliance is good. There is no change in his home blood glucose trend. He does not see a podiatrist.Eye exam is not current.  Foot Pain This is a recurrent problem. The current episode started more than 1 year ago. The problem occurs constantly. The problem has been unchanged. Pertinent negatives include no arthralgias, chest pain or fatigue. Exacerbated by: walking. Treatments tried: foot surgery in the past. The treatment provided significant relief.     Assessment and Plan: Liahm is a 46 y.o. male with: Diabetes mellitus without complication (HCC) Patient had a history of diabetes, with an A1c of 11 and started loosing weight and exeresis and came off of all medication with an A1c of 5.4. patient is in clinic today to recheck diabetes since he has gained weight back and blood sugar this morning is 130  Left foot pain podiatry referral completed for left foot pain  Annual physical exam Completed head to toe assessment, provided education on health maintenance and preventative care. Education provided with printed hand out give. Labs completed at work . Patient will fax in value. A1c completed results pending. Colonoscopy referral completed to GI,    Return in about 6 months (around 12/08/2022) for chronic disease management.   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   No current facility-administered medications for this visit.   Allergies: Allergies  Allergen Reactions   Erythromycin    Erythromycin Base Rash    AS A CHILD   Social History: Social History   Socioeconomic History   Marital status: Married    Spouse name: Victorino Dike   Number of children: 2   Years of education: Not on file   Highest education level: Not on file  Occupational History   Not on file  Tobacco Use   Smoking status: Former    Types: E-cigarettes   Smokeless tobacco: Former    Types: Chew, Snuff  Vaping Use   Vaping Use: Former   Substances: Nicotine  Substance and Sexual Activity   Alcohol use: Yes    Alcohol/week: 6.0 - 12.0 standard drinks of alcohol    Types: 6 - 12 Cans of beer per week    Comment: social   Drug use: No   Sexual activity: Yes  Other Topics Concern   Not on file  Social History Narrative   Not on file   Social Determinants of Health   Financial Resource Strain: Not on file  Food Insecurity: Not on file  Transportation Needs: Not on file  Physical Activity: Not on file  Stress: Not on file  Social Connections: Not on file       Family History: Family History  Problem Relation Age of Onset   Cancer Mother        breast   Diabetes Mother    Dementia Mother    Cancer Father        skin         Review of Systems  Constitutional: Negative.  Negative for fatigue and weight loss.  HENT: Negative.    Eyes: Negative.   Respiratory: Negative.    Cardiovascular: Negative.  Negative for chest pain.  Gastrointestinal: Negative.   Endocrine:  Negative for polydipsia and polyphagia.  Genitourinary: Negative.   Musculoskeletal:  Negative for arthralgias.       Left foot pain  All other systems reviewed and are negative.  Objective: BP 132/89   Pulse 72   Temp 98.7 F (37.1 C)   Ht 6\' 5"  (1.956 m)   Wt (!) 304 lb (137.9 kg)   SpO2 97%   BMI 36.05 kg/m  Body mass index is 36.05 kg/m. Physical Exam Vitals and nursing note reviewed.  Constitutional:      Appearance: Normal appearance.  HENT:     Head: Normocephalic.     Right Ear: External ear normal.     Left Ear: External ear normal.     Nose: Nose normal.     Mouth/Throat:     Pharynx: Oropharynx is clear.  Eyes:     Conjunctiva/sclera: Conjunctivae normal.  Cardiovascular:     Rate and Rhythm: Normal rate and regular rhythm.  Pulses: Normal pulses.     Heart sounds: Normal heart sounds.  Pulmonary:     Effort: Pulmonary effort is normal.     Breath sounds: Normal breath sounds.  Abdominal:     General: Bowel sounds are normal.  Musculoskeletal:       Legs:     Comments: Left foot/heel pain  Skin:    General: Skin is warm.     Findings: No erythema or rash.  Neurological:     General: No focal deficit present.     Mental Status: He is alert and oriented to person, place, and time.  Psychiatric:        Mood and Affect: Mood normal.        Behavior: Behavior normal.    The plan was reviewed with the patient/family, and all questions/concerned were addressed.  It was my pleasure to see Samuel Sellers today and participate in his care. Please feel free to contact me with any questions or concerns.  Sincerely,  Jac Canavan NP [place]

## 2022-06-09 ENCOUNTER — Other Ambulatory Visit: Payer: Self-pay | Admitting: Nurse Practitioner

## 2022-06-09 ENCOUNTER — Encounter: Payer: Self-pay | Admitting: *Deleted

## 2022-06-09 NOTE — Progress Notes (Signed)
Okay; thanks.

## 2022-06-17 ENCOUNTER — Ambulatory Visit (INDEPENDENT_AMBULATORY_CARE_PROVIDER_SITE_OTHER): Payer: PRIVATE HEALTH INSURANCE

## 2022-06-17 ENCOUNTER — Other Ambulatory Visit: Payer: Self-pay | Admitting: Podiatry

## 2022-06-17 ENCOUNTER — Ambulatory Visit (INDEPENDENT_AMBULATORY_CARE_PROVIDER_SITE_OTHER): Payer: PRIVATE HEALTH INSURANCE | Admitting: Podiatry

## 2022-06-17 DIAGNOSIS — M7662 Achilles tendinitis, left leg: Secondary | ICD-10-CM

## 2022-06-17 DIAGNOSIS — M722 Plantar fascial fibromatosis: Secondary | ICD-10-CM

## 2022-06-17 DIAGNOSIS — M216X2 Other acquired deformities of left foot: Secondary | ICD-10-CM

## 2022-06-17 DIAGNOSIS — M7732 Calcaneal spur, left foot: Secondary | ICD-10-CM

## 2022-06-17 MED ORDER — MELOXICAM 15 MG PO TABS: 15.0000 mg | ORAL_TABLET | Freq: Every day | ORAL | 0 refills | Status: DC

## 2022-06-17 NOTE — Patient Instructions (Addendum)
For instructions on how to put on your Night Splint, please visit [email or web address]  You can also use Voltaren gel if not taking the oral anti-inflammatory.   Achilles Tendinitis  with Rehab Achilles tendinitis is a disorder of the Achilles tendon. The Achilles tendon connects the large calf muscles (Gastrocnemius and Soleus) to the heel bone (calcaneus). This tendon is sometimes called the heel cord. It is important for pushing-off and standing on your toes and is important for walking, running, or jumping. Tendinitis is often caused by overuse and repetitive microtrauma. SYMPTOMS Pain, tenderness, swelling, warmth, and redness may occur over the Achilles tendon even at rest. Pain with pushing off, or flexing or extending the ankle. Pain that is worsened after or during activity. CAUSES  Overuse sometimes seen with rapid increase in exercise programs or in sports requiring running and jumping. Poor physical conditioning (strength and flexibility or endurance). Running sports, especially training running down hills. Inadequate warm-up before practice or play or failure to stretch before participation. Injury to the tendon. PREVENTION  Warm up and stretch before practice or competition. Allow time for adequate rest and recovery between practices and competition. Keep up conditioning. Keep up ankle and leg flexibility. Improve or keep muscle strength and endurance. Improve cardiovascular fitness. Use proper technique. Use proper equipment (shoes, skates). To help prevent recurrence, taping, protective strapping, or an adhesive bandage may be recommended for several weeks after healing is complete. PROGNOSIS  Recovery may take weeks to several months to heal. Longer recovery is expected if symptoms have been prolonged. Recovery is usually quicker if the inflammation is due to a direct blow as compared with overuse or sudden strain. RELATED COMPLICATIONS  Healing time will be  prolonged if the condition is not correctly treated. The injury must be given plenty of time to heal. Symptoms can reoccur if activity is resumed too soon. Untreated, tendinitis may increase the risk of tendon rupture requiring additional time for recovery and possibly surgery. TREATMENT  The first treatment consists of rest anti-inflammatory medication, and ice to relieve the pain. Stretching and strengthening exercises after resolution of pain will likely help reduce the risk of recurrence. Referral to a physical therapist or athletic trainer for further evaluation and treatment may be helpful. A walking boot or cast may be recommended to rest the Achilles tendon. This can help break the cycle of inflammation and microtrauma. Arch supports (orthotics) may be prescribed or recommended by your caregiver as an adjunct to therapy and rest. Surgery to remove the inflamed tendon lining or degenerated tendon tissue is rarely necessary and has shown less than predictable results. MEDICATION  Nonsteroidal anti-inflammatory medications, such as aspirin and ibuprofen, may be used for pain and inflammation relief. Do not take within 7 days before surgery. Take these as directed by your caregiver. Contact your caregiver immediately if any bleeding, stomach upset, or signs of allergic reaction occur. Other minor pain relievers, such as acetaminophen, may also be used. Pain relievers may be prescribed as necessary by your caregiver. Do not take prescription pain medication for longer than 4 to 7 days. Use only as directed and only as much as you need. Cortisone injections are rarely indicated. Cortisone injections may weaken tendons and predispose to rupture. It is better to give the condition more time to heal than to use them. HEAT AND COLD Cold is used to relieve pain and reduce inflammation for acute and chronic Achilles tendinitis. Cold should be applied for 10 to 15 minutes every 2 to 3  hours for inflammation  and pain and immediately after any activity that aggravates your symptoms. Use ice packs or an ice massage. Heat may be used before performing stretching and strengthening activities prescribed by your caregiver. Use a heat pack or a warm soak. SEEK MEDICAL CARE IF: Symptoms get worse or do not improve in 2 weeks despite treatment. New, unexplained symptoms develop. Drugs used in treatment may produce side effects.  EXERCISES:  RANGE OF MOTION (ROM) AND STRETCHING EXERCISES - Achilles Tendinitis  These exercises may help you when beginning to rehabilitate your injury. Your symptoms may resolve with or without further involvement from your physician, physical therapist or athletic trainer. While completing these exercises, remember:  Restoring tissue flexibility helps normal motion to return to the joints. This allows healthier, less painful movement and activity. An effective stretch should be held for at least 30 seconds. A stretch should never be painful. You should only feel a gentle lengthening or release in the stretched tissue.  STRETCH  Gastroc, Standing  Place hands on wall. Extend right / left leg, keeping the front knee somewhat bent. Slightly point your toes inward on your back foot. Keeping your right / left heel on the floor and your knee straight, shift your weight toward the wall, not allowing your back to arch. You should feel a gentle stretch in the right / left calf. Hold this position for 10 seconds. Repeat 3 times. Complete this stretch 2 times per day.  STRETCH  Soleus, Standing  Place hands on wall. Extend right / left leg, keeping the other knee somewhat bent. Slightly point your toes inward on your back foot. Keep your right / left heel on the floor, bend your back knee, and slightly shift your weight over the back leg so that you feel a gentle stretch deep in your back calf. Hold this position for 10 seconds. Repeat 3 times. Complete this stretch 2 times per  day.  STRETCH  Gastrocsoleus, Standing  Note: This exercise can place a lot of stress on your foot and ankle. Please complete this exercise only if specifically instructed by your caregiver.  Place the ball of your right / left foot on a step, keeping your other foot firmly on the same step. Hold on to the wall or a rail for balance. Slowly lift your other foot, allowing your body weight to press your heel down over the edge of the step. You should feel a stretch in your right / left calf. Hold this position for 10 seconds. Repeat this exercise with a slight bend in your knee. Repeat 3 times. Complete this stretch 2 times per day.   STRENGTHENING EXERCISES - Achilles Tendinitis These exercises may help you when beginning to rehabilitate your injury. They may resolve your symptoms with or without further involvement from your physician, physical therapist or athletic trainer. While completing these exercises, remember:  Muscles can gain both the endurance and the strength needed for everyday activities through controlled exercises. Complete these exercises as instructed by your physician, physical therapist or athletic trainer. Progress the resistance and repetitions only as guided. You may experience muscle soreness or fatigue, but the pain or discomfort you are trying to eliminate should never worsen during these exercises. If this pain does worsen, stop and make certain you are following the directions exactly. If the pain is still present after adjustments, discontinue the exercise until you can discuss the trouble with your clinician.  STRENGTH - Plantar-flexors  Sit with your right /  left leg extended. Holding onto both ends of a rubber exercise band/tubing, loop it around the ball of your foot. Keep a slight tension in the band. Slowly push your toes away from you, pointing them downward. Hold this position for 10 seconds. Return slowly, controlling the tension in the band/tubing. Repeat  3 times. Complete this exercise 2 times per day.   STRENGTH - Plantar-flexors  Stand with your feet shoulder width apart. Steady yourself with a wall or table using as little support as needed. Keeping your weight evenly spread over the width of your feet, rise up on your toes.* Hold this position for 10 seconds. Repeat 3 times. Complete this exercise 2 times per day.  *If this is too easy, shift your weight toward your right / left leg until you feel challenged. Ultimately, you may be asked to do this exercise with your right / left foot only.  STRENGTH  Plantar-flexors, Eccentric  Note: This exercise can place a lot of stress on your foot and ankle. Please complete this exercise only if specifically instructed by your caregiver.  Place the balls of your feet on a step. With your hands, use only enough support from a wall or rail to keep your balance. Keep your knees straight and rise up on your toes. Slowly shift your weight entirely to your right / left toes and pick up your opposite foot. Gently and with controlled movement, lower your weight through your right / left foot so that your heel drops below the level of the step. You will feel a slight stretch in the back of your calf at the end position. Use the healthy leg to help rise up onto the balls of both feet, then lower weight only on the right / left leg again. Build up to 15 repetitions. Then progress to 3 consecutive sets of 15 repetitions.* After completing the above exercise, complete the same exercise with a slight knee bend (about 30 degrees). Again, build up to 15 repetitions. Then progress to 3 consecutive sets of 15 repetitions.* Perform this exercise 2 times per day.  *When you easily complete 3 sets of 15, your physician, physical therapist or athletic trainer may advise you to add resistance by wearing a backpack filled with additional weight.  STRENGTH - Plantar Flexors, Seated  Sit on a chair that allows your feet to rest  flat on the ground. If necessary, sit at the edge of the chair. Keeping your toes firmly on the ground, lift your right / left heel as far as you can without increasing any discomfort in your ankle. Repeat 3 times. Complete this exercise 2 times a day.

## 2022-06-17 NOTE — Progress Notes (Signed)
Subjective:   Patient ID: Samuel Sellers, male   DOB: 46 y.o.   MRN: [id number]   HPI Chief Complaint  Patient presents with   Plantar Fasciitis    Left foot heel and arch pain, started 2 month ago, sharp shooting needles, Rate of pain 8 out of 10, having pain after sitting for a long time, TX: inserts,   X- Rays done today    46 year old male presents with above concerns.  Pain is worse in the mornings or if he is sitting down and stands back up and gets moving. As he starts moving it eases up but it takes awhile. He did get some OTC inserts which helps some. No injuries. No numbness/tingling/swelling.    A1c 7.1 on 06/08/2022  Review of Systems  All other systems reviewed and are negative.  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, Disp: 30 capsule, Rfl: 0  Allergies  Allergen Reactions   Erythromycin    Erythromycin Base Rash    AS A CHILD          Objective:  Physical Exam  General: AAO x3, NAD  Dermatological: Skin is warm, dry and supple bilateral. There are no open sores, no preulcerative lesions, no rash or signs of infection present.  Vascular: Dorsalis Pedis artery and Posterior Tibial artery pedal pulses are 2/4 bilateral with immedate capillary fill time.  There is no pain with calf compression,  swelling, warmth, erythema.   Neruologic: Grossly intact via light touch bilateral.   Musculoskeletal: Tenderness along the left posterior aspect of the heel and on the distal portion Achilles tendon insertion calcaneus.  Heel spurs palpable.  Equinus present.  No pain without compression of calcaneus.  No erythema or warmth.  Flexor, extensor tendons intact.  MMT 5/5.  Gait: Unassisted, Nonantalgic.    Assessment:   Left posterior heel pain, heel spur with insertional Achilles tendinitis     Plan:  -Treatment options discussed including all alternatives, risks, and complications -Etiology of symptoms were discussed -X-rays were obtained and reviewed with the patient.  3 views left foot obtained.  No evidence of acute fracture.  Heel spurring noted along the posterior calcaneus -Prescribed mobic. Discussed side effects of the medication and directed to stop if any are to occur and call the office.  -Dispensed night splint to help with the equinus, stretching the Achilles tendon. -Discussed home stretching, rehab exercises. -Discussed supportive shoe gear and inserts.  Trula Slade DPM

## 2022-06-18 ENCOUNTER — Other Ambulatory Visit: Payer: Self-pay | Admitting: Podiatry

## 2022-06-18 NOTE — Progress Notes (Signed)
[place]

## 2022-06-21 ENCOUNTER — Other Ambulatory Visit: Payer: Self-pay | Admitting: Podiatry

## 2022-06-21 MED ORDER — MELOXICAM 15 MG PO TABS: 15.0000 mg | ORAL_TABLET | Freq: Every day | ORAL | 0 refills | Status: DC | PRN

## 2022-07-22 ENCOUNTER — Other Ambulatory Visit: Payer: Self-pay | Admitting: Podiatry

## 2022-07-22 DIAGNOSIS — M7732 Calcaneal spur, left foot: Secondary | ICD-10-CM

## 2022-07-22 DIAGNOSIS — M7662 Achilles tendinitis, left leg: Secondary | ICD-10-CM

## 2022-08-10 IMAGING — MR MR CERVICAL SPINE W/O CM
5 series · 29 of 48 positions shown · non-contrast
Comparison: None.

CLINICAL DATA: Pain in the irritation between the shoulder blades.

EXAM:
MRI CERVICAL SPINE WITHOUT CONTRAST
TECHNIQUE: Multiplanar, multisequence MR imaging of the cervical spine was
performed. No intravenous contrast was administered.

[Series 5: T2 · sagittal · 3.0mm · 0.55mm/px · 6 of 13 slices shown (1 of 2)]
[im 1/13]
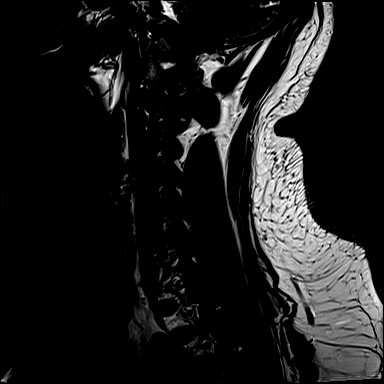
[im 3/13]
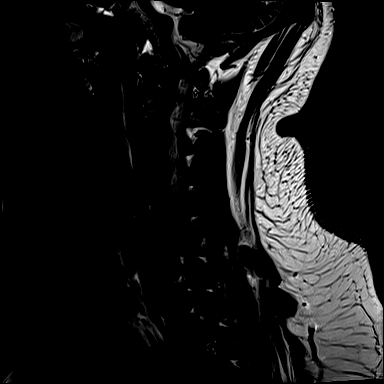
[im 5/13]
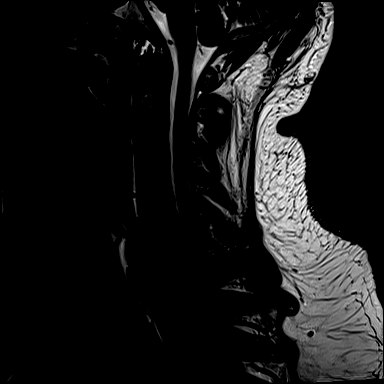
[im 8/13]
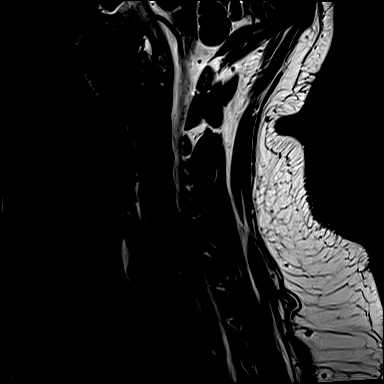
[im 10/13]
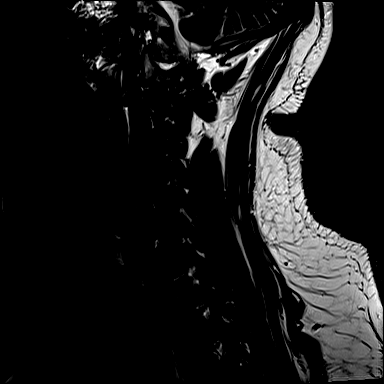
[im 13/13]
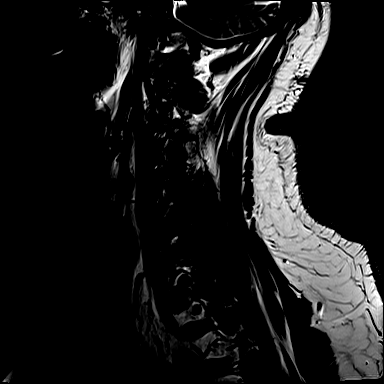

[Series 6: T1 · sagittal · 3.0mm · 0.66mm/px · 6 of 13 slices shown]
[im 1/13]
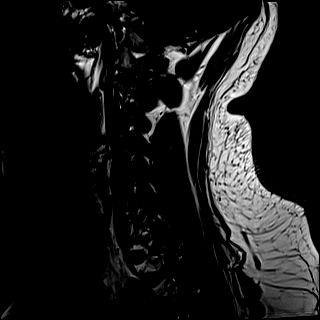
[im 3/13]
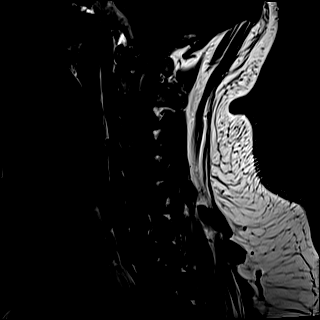
[im 5/13]
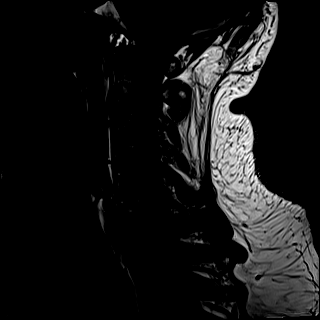
[im 8/13]
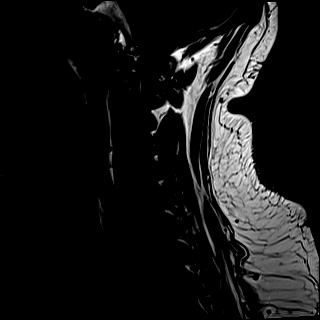
[im 10/13]
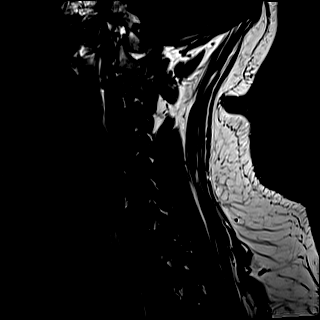
[im 13/13]
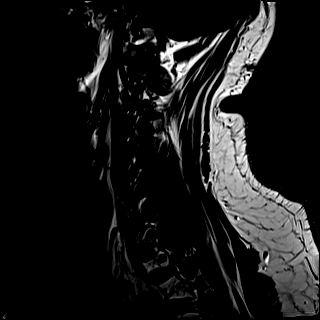

[Series 7: STIR · sagittal · 3.0mm · 0.33mm/px · 6 of 13 slices shown]
[im 1/13]
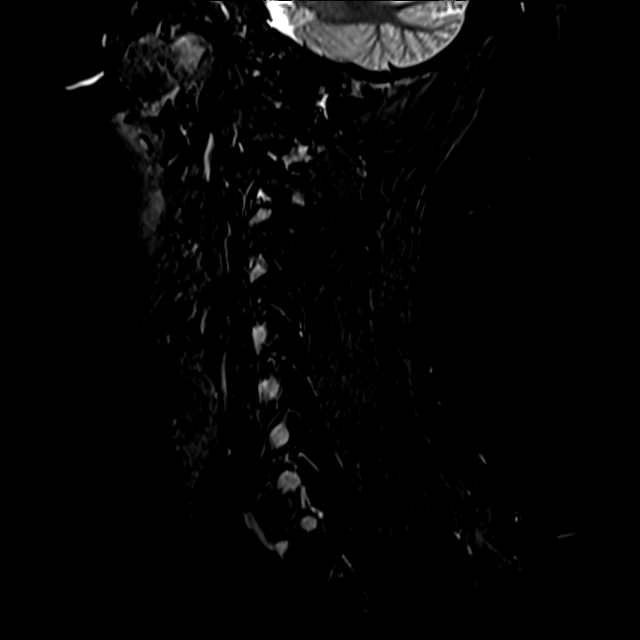
[im 3/13]
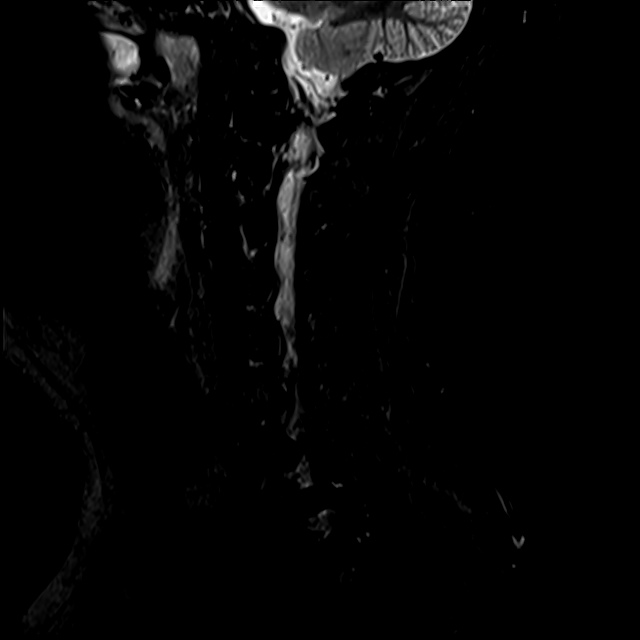
[im 5/13]
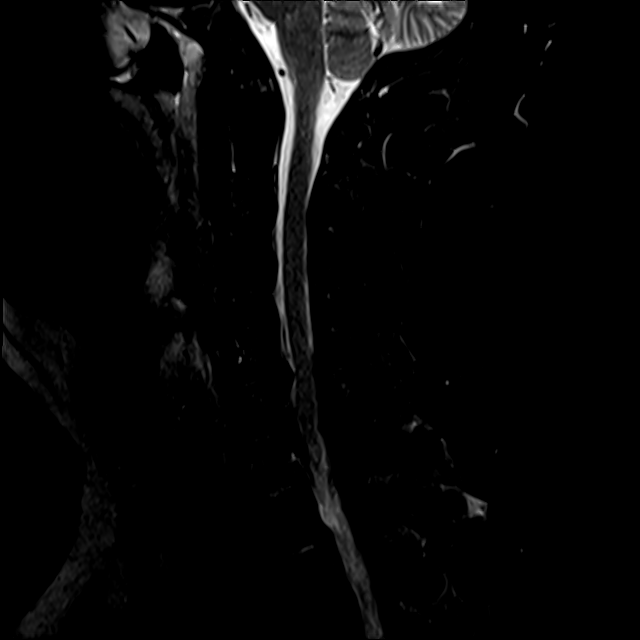
[im 8/13]
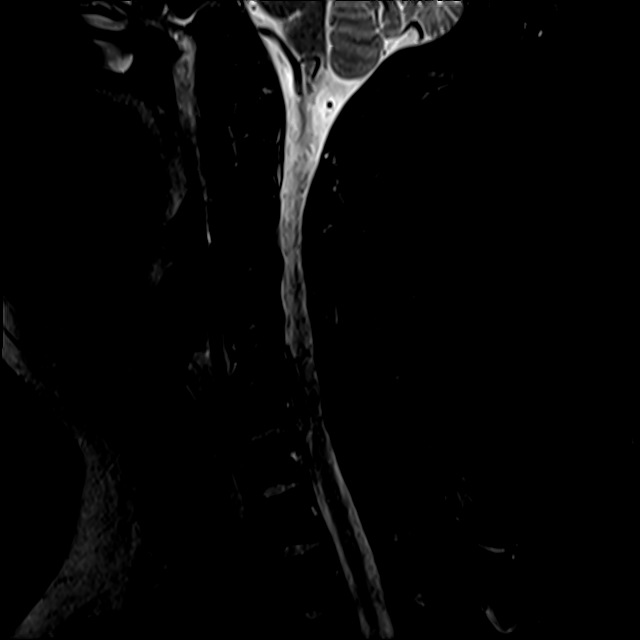
[im 10/13]
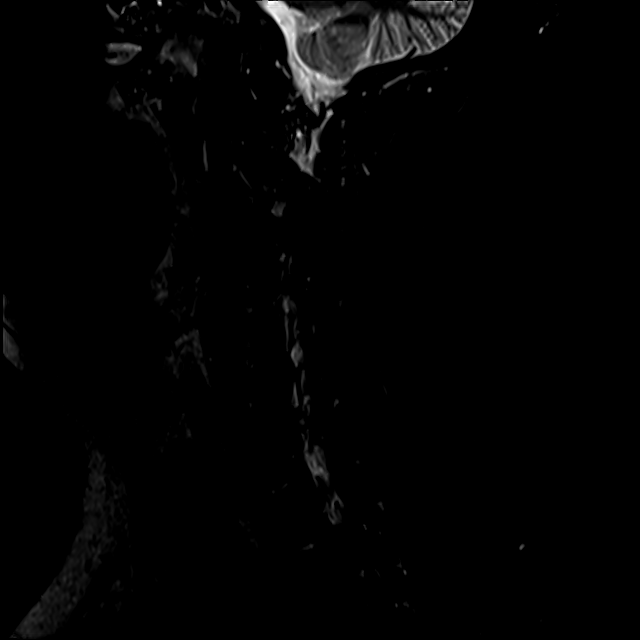
[im 13/13]
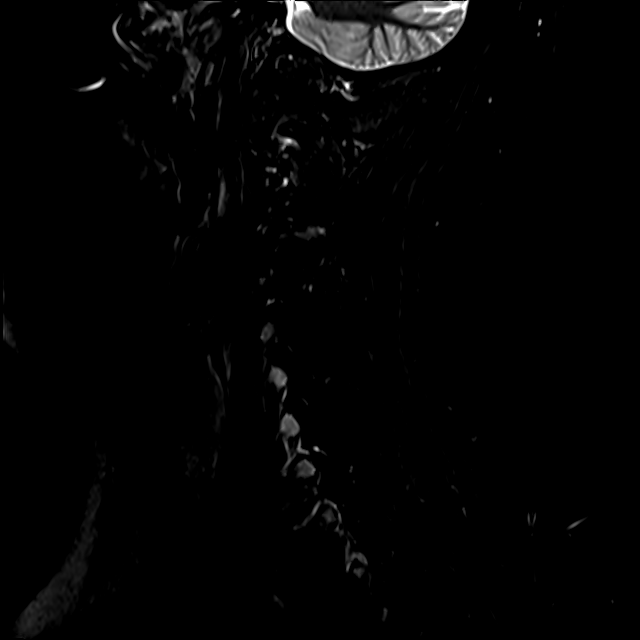

[Series 8: T2 · axial · 3.0mm · 0.50mm/px · z∈[-51,+60]mm · 9 of 30 slices shown (2 of 2)]
[im 1/30]
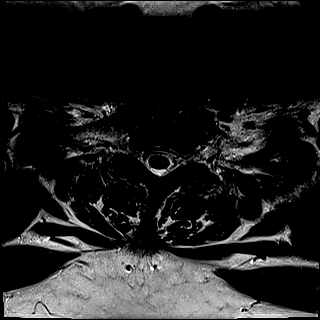
[im 5/30]
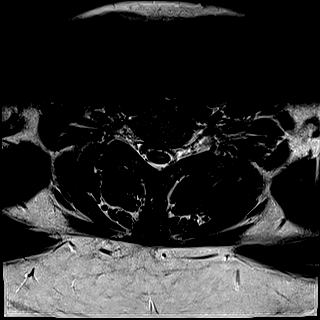
[im 9/30]
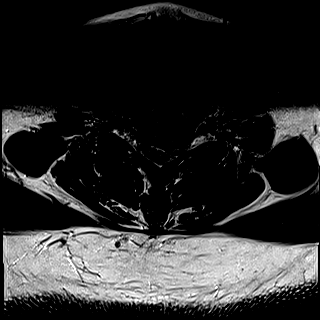
[im 13/30]
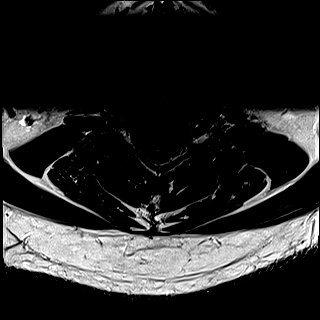
[im 15/30]
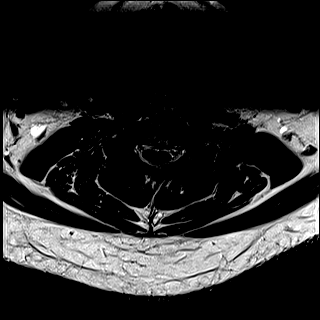
[im 17/30]
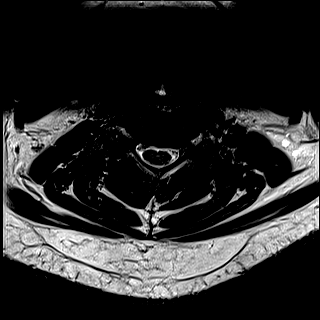
[im 21/30]
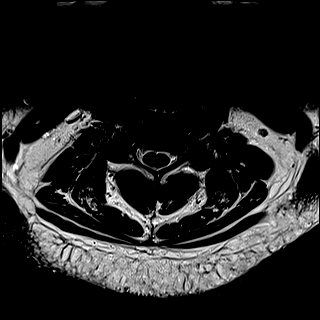
[im 25/30]
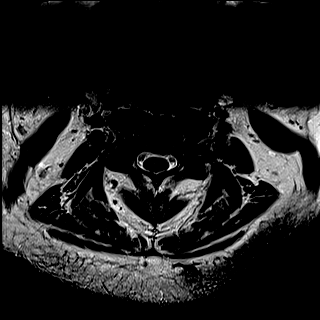
[im 30/30]
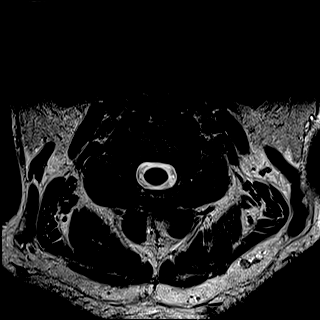

[Series 9: GRE · axial · 3.0mm · 0.42mm/px · z∈[-51,-36]mm · 2 of 30 slices shown]
[im 1/30]
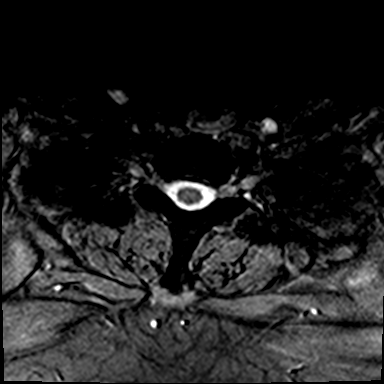
[im 5/30]
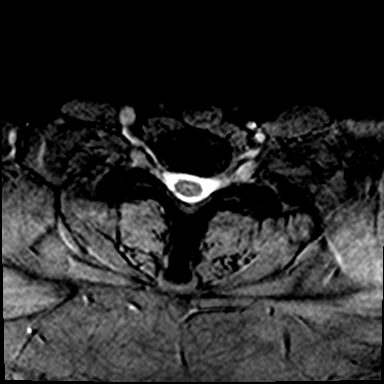

[29 of 48 positions shown; findings below may reference images not displayed]

FINDINGS: Alignment: Normal

Vertebrae: No fracture, evidence of discitis, or bone lesion.

Cord: Normal signal and morphology.

Posterior Fossa, vertebral arteries, paraspinal tissues:
Subcentimeter focal T2 hyperintensity superficial to the T1 spinous
process, up lifting the supraspinous ligament.

Disc levels:

C2-3: Unremarkable.

C3-4: Unremarkable.

C4-5: Unremarkable.

C5-6: Left eccentric protrusion with mild flattening of the cord.
Extension towards the left foramen where there is moderate stenosis.
Right foraminal narrowing is mild based on axial gradient images

C6-7: Left paracentral extrusion with left cord flattening and left
C7 impingement.

C7-T1:Unremarkable.
IMPRESSION: 1. C6-7 left paracentral extrusion with left cord flattening and
intradural C7 impingement.
2. C5-6 broad disc protrusion with mild cord flattening.
3. Supraspinous ganglion at the level of the T1.
